# Patient Record
Sex: Male | Born: 1965
Health system: Southern US, Community
[De-identification: ages and names within clinical notes are randomized; demographics above are authoritative.]

## PROBLEM LIST (undated history)

## (undated) DIAGNOSIS — I10 Essential (primary) hypertension: Secondary | ICD-10-CM

## (undated) HISTORY — PX: HERNIA REPAIR: SHX51

---

## 1999-03-10 ENCOUNTER — Emergency Department (HOSPITAL_COMMUNITY): Admission: EM | Admit: 1999-03-10 | Discharge: 1999-03-10 | Payer: Self-pay

## 2015-09-18 ENCOUNTER — Emergency Department (HOSPITAL_COMMUNITY)
Admission: EM | Admit: 2015-09-18 | Discharge: 2015-09-18 | Disposition: A | Discharge status: Home / Self Care | Payer: BLUE CROSS/BLUE SHIELD | Source: Home / Self Care | Attending: Emergency Medicine | Admitting: Emergency Medicine

## 2015-09-18 ENCOUNTER — Encounter (HOSPITAL_COMMUNITY): Payer: Self-pay | Admitting: Emergency Medicine

## 2015-09-18 ENCOUNTER — Emergency Department (INDEPENDENT_AMBULATORY_CARE_PROVIDER_SITE_OTHER): Payer: BLUE CROSS/BLUE SHIELD

## 2015-09-18 DIAGNOSIS — J309 Allergic rhinitis, unspecified: Secondary | ICD-10-CM | POA: Diagnosis not present

## 2015-09-18 DIAGNOSIS — J40 Bronchitis, not specified as acute or chronic: Secondary | ICD-10-CM | POA: Diagnosis not present

## 2015-09-18 HISTORY — DX: Essential (primary) hypertension: I10

## 2015-09-18 MED ORDER — PREDNISONE 50 MG PO TABS: ORAL_TABLET | ORAL | Status: DC

## 2015-09-18 MED ORDER — CETIRIZINE HCL 10 MG PO TABS: 10.0000 mg | ORAL_TABLET | Freq: Every day | ORAL | Status: AC

## 2015-09-18 MED ORDER — FLUTICASONE PROPIONATE 50 MCG/ACT NA SUSP: 2.0000 | Freq: Every day | NASAL | Status: DC

## 2015-09-18 NOTE — ED Provider Notes (Signed)
CSN: [id number]     Arrival date & time 09/18/15  1619 History   First MD Initiated Contact with Patient 09/18/15 1657     Chief Complaint  Patient presents with  . Cough   (Consider location/radiation/quality/duration/timing/severity/associated sxs/prior Treatment) HPI  He is a 49 year old man here for evaluation of cough. He states for the last 3 weeks he has had a lot of postnasal drainage and cough, particularly at night. About 5 days ago, he started feeling poorly. His symptoms worsened today. He reports a discomfort in his left chest. He states it feels like there is a slight weight on his chest. Today, he reports some heaviness in his left arm. Nothing makes this better or worse. He was on the third floor of a building and going up the stairs does not worsen his pain. The pain is resolved with laying flat.  He also reports an intermittent bandlike headache. This resolves with some Advil. He has a history of hypertension. He is a nonsmoker. No family history of cardiac disease.  Past Medical History  Diagnosis Date  . Hypertension    Past Surgical History  Procedure Laterality Date  . Hernia repair     No family history on file. Social History  Substance Use Topics  . Smoking status: Never Smoker   . Smokeless tobacco: None  . Alcohol Use: Yes    Review of Systems As in history of present illness Allergies  Acetaminophen  Home Medications   Prior to Admission medications   Medication Sig Start Date End Date Taking? Authorizing Provider  lisinopril-hydrochlorothiazide (PRINZIDE,ZESTORETIC) 20-12.5 MG tablet Take 1 tablet by mouth daily.   Yes Historical Provider, MD  cetirizine (ZYRTEC) 10 MG tablet Take 1 tablet (10 mg total) by mouth daily. 09/18/15   Charm RingsErin J Mohamad Bruso, MD  fluticasone (FLONASE) 50 MCG/ACT nasal spray Place 2 sprays into both nostrils daily. 09/18/15   Charm RingsErin J Mckinnley Smithey, MD  predniSONE (DELTASONE) 50 MG tablet Take 1 pill daily for 5 days. 09/18/15   Charm RingsErin J  Novalyn Lajara, MD   Meds Ordered and Administered this Visit  Medications - No data to display  BP 123/79 mmHg  Pulse 100  Temp(Src) 99.2 F (37.3 C) (Oral)  Resp 18  SpO2 100% No data found.   Physical Exam  Constitutional: He is oriented to person, place, and time. He appears well-developed and well-nourished. No distress.  HENT:  Mouth/Throat: No oropharyngeal exudate.  Nasal mucosa is quite erythematous and boggy. There is some postnasal drainage seen. TMs normal bilaterally.  Neck: Neck supple.  Cardiovascular: Normal rate, regular rhythm and normal heart sounds.   No murmur heard. Pulmonary/Chest: Effort normal and breath sounds normal. No respiratory distress. He has no wheezes. He has no rales.  Lymphadenopathy:    He has no cervical adenopathy.  Neurological: He is alert and oriented to person, place, and time.    ED Course  Procedures (including critical care time) ED ECG REPORT   Date: 09/18/2015  Rate: 64  Rhythm: normal sinus rhythm  QRS Axis: left  Intervals: normal  ST/T Wave abnormalities: normal  Conduction Disutrbances:none  Narrative Interpretation: normal ekg; reviewed with Dr. Mayford Knifeurner, cardiologist on call.  Old EKG Reviewed: none available  I have personally reviewed the EKG tracing and agree with the computerized printout as noted.  Labs Review Labs Reviewed - No data to display  Imaging Review Dg Chest 2 View  09/18/2015  CLINICAL DATA:  Patient complains of cough x 3 to 4 weeks  w/ CP EXAM: CHEST  2 VIEW COMPARISON:  None. FINDINGS: Midline trachea. Normal heart size and mediastinal contours. No pleural effusion or pneumothorax. Clear lungs. IMPRESSION: No acute cardiopulmonary disease. Electronically Signed   By: Jeronimo Greaves M.D.   On: 09/18/2015 17:33      MDM   1. Bronchitis   2. Allergic rhinitis, unspecified allergic rhinitis type    We'll treat with prednisone, Zyrtec, and Flonase. Return precautions reviewed.    Charm Rings,  MD 09/18/15 [id number]

## 2015-09-18 NOTE — Discharge Instructions (Signed)
You are having some allergy symptoms, but have also developed a bronchitis. Take prednisone as prescribed. Please take Zyrtec and use Flonase daily. You should see improvement over the next 2-3 days. If your symptoms change or worsen, please go to the emergency room.

## 2015-09-18 NOTE — ED Notes (Signed)
Patient reports a 2 week history of cough, phlegm and particularly congested at night.  Feels sensation in left chest, fluttery feeling intermittently in left chest, c/o headache. At times, patient feels as though he has tight cap on his head.  Patient reports today he has felt left hand heaviness, no sob. Patient exercises 3-4 times per week

## 2015-10-15 ENCOUNTER — Other Ambulatory Visit: Payer: Self-pay | Admitting: Internal Medicine

## 2015-10-15 DIAGNOSIS — M5412 Radiculopathy, cervical region: Secondary | ICD-10-CM

## 2015-11-03 ENCOUNTER — Ambulatory Visit
Admission: RE | Admit: 2015-11-03 | Discharge: 2015-11-03 | Disposition: A | Discharge status: Home / Self Care | Payer: BLUE CROSS/BLUE SHIELD | Source: Ambulatory Visit | Attending: Internal Medicine | Admitting: Internal Medicine

## 2015-11-03 DIAGNOSIS — M5412 Radiculopathy, cervical region: Secondary | ICD-10-CM

## 2015-11-19 ENCOUNTER — Emergency Department (HOSPITAL_COMMUNITY)
Admission: EM | Admit: 2015-11-19 | Discharge: 2015-11-19 | Disposition: A | Discharge status: Home / Self Care | Payer: BLUE CROSS/BLUE SHIELD | Source: Home / Self Care | Attending: Family Medicine | Admitting: Family Medicine

## 2015-11-19 ENCOUNTER — Encounter (HOSPITAL_COMMUNITY): Payer: Self-pay | Admitting: Emergency Medicine

## 2015-11-19 DIAGNOSIS — H6591 Unspecified nonsuppurative otitis media, right ear: Secondary | ICD-10-CM | POA: Diagnosis not present

## 2015-11-19 MED ORDER — CETIRIZINE HCL 10 MG PO CAPS: 10.0000 mg | ORAL_CAPSULE | Freq: Once | ORAL | Status: DC

## 2015-11-19 MED ORDER — AMOXICILLIN 500 MG PO CAPS: 500.0000 mg | ORAL_CAPSULE | Freq: Three times a day (TID) | ORAL | Status: DC

## 2015-11-19 NOTE — Discharge Instructions (Signed)
Serous Otitis Media Serous otitis media is fluid in the middle ear space. This space contains the bones for hearing and air. Air in the middle ear space helps to transmit sound.  The air gets there through the eustachian tube. This tube goes from the back of the nose (nasopharynx) to the middle ear space. It keeps the pressure in the middle ear the same as the outside world. It also helps to drain fluid from the middle ear space. CAUSES  Serous otitis media occurs when the eustachian tube gets blocked. Blockage can come from:  Ear infections.  Colds and other upper respiratory infections.  Allergies.  Irritants such as cigarette smoke.  Sudden changes in air pressure (such as descending in an airplane).  Enlarged adenoids.  A mass in the nasopharynx. During colds and upper respiratory infections, the middle ear space can become temporarily filled with fluid. This can happen after an ear infection also. Once the infection clears, the fluid will generally drain out of the ear through the eustachian tube. If it does not, then serous otitis media occurs. SIGNS AND SYMPTOMS   Hearing loss.  A feeling of fullness in the ear, without pain.  Young children may not show any symptoms but may show slight behavioral changes, such as agitation, ear pulling, or crying. DIAGNOSIS  Serous otitis media is diagnosed by an ear exam. Tests may be done to check on the movement of the eardrum. Hearing exams may also be done. TREATMENT  The fluid most often goes away without treatment. If allergy is the cause, allergy treatment may be helpful. Fluid that persists for several months may require minor surgery. A small tube is placed in the eardrum to:  Drain the fluid.  Restore the air in the middle ear space. In certain situations, antibiotic medicines are used to avoid surgery. Surgery may be done to remove enlarged adenoids (if this is the cause). HOME CARE INSTRUCTIONS   Keep children away from  tobacco smoke.  Keep all follow-up visits as directed by your health care provider. SEEK MEDICAL CARE IF:   Your hearing is not better in 3 months.  Your hearing is worse.  You have ear pain.  You have drainage from the ear.  You have dizziness.  You have serous otitis media only in one ear or have any bleeding from your nose (epistaxis).  You notice a lump on your neck. MAKE SURE YOU:  Understand these instructions.   Will watch your condition.   Will get help right away if you are not doing well or get worse.    This information is not intended to replace advice given to you by your health care provider. Make sure you discuss any questions you have with your health care provider.   Document Released: 01/06/2004 Document Revised: 11/06/2014 Document Reviewed: 05/13/2013 Elsevier Interactive Patient Education 2016 [employer].  

## 2015-11-19 NOTE — ED Provider Notes (Signed)
CSN: [id number]     Arrival date & time 11/19/15  1644 History   First MD Initiated Contact with Patient 11/19/15 1709     Chief Complaint  Patient presents with  . Ear Pain   (Consider location/radiation/quality/duration/timing/severity/associated sxs/prior Treatment) HPI Patient states for the last 2-3 days he's been feeling off balance. He was seen by his primary care provider on Wednesday and was told that he had a little early change in his right ear but no definitive diagnosis was made. A cyst symptoms feel worse at this time feels a little drunk. No associated nausea. He has had a recent URI. Past Medical History  Diagnosis Date  . Hypertension    Past Surgical History  Procedure Laterality Date  . Hernia repair     No family history on file. Social History  Substance Use Topics  . Smoking status: Never Smoker   . Smokeless tobacco: None  . Alcohol Use: Yes    Review of Systems ROS +'ve dizziness right ear fullness.  Denies: HEADACHE, NAUSEA, ABDOMINAL PAIN, CHEST PAIN, CONGESTION, DYSURIA, SHORTNESS OF BREATH  Allergies  Acetaminophen  Home Medications   Prior to Admission medications   Medication Sig Start Date End Date Taking? Authorizing Provider  amoxicillin (AMOXIL) 500 MG capsule Take 1 capsule (500 mg total) by mouth 3 (three) times daily. 11/19/15   Tharon Aquas, PA  cetirizine (ZYRTEC) 10 MG tablet Take 1 tablet (10 mg total) by mouth daily. 09/18/15   Charm Rings, MD  Cetirizine HCl 10 MG CAPS Take 1 capsule (10 mg total) by mouth once. 11/19/15   Tharon Aquas, PA  fluticasone (FLONASE) 50 MCG/ACT nasal spray Place 2 sprays into both nostrils daily. 09/18/15   Charm Rings, MD  lisinopril-hydrochlorothiazide (PRINZIDE,ZESTORETIC) 20-12.5 MG tablet Take 1 tablet by mouth daily.    Historical Provider, MD  predniSONE (DELTASONE) 50 MG tablet Take 1 pill daily for 5 days. 09/18/15   Charm Rings, MD   Meds Ordered and Administered this Visit   Medications - No data to display  BP 141/90 mmHg  Pulse 82  Temp(Src) 98.2 F (36.8 C) (Oral)  Resp 18  SpO2 100% No data found.   Physical Exam  Constitutional: He is oriented to person, place, and time. He appears well-developed and well-nourished.  HENT:  Head: Normocephalic and atraumatic.  Right Ear: Hearing, tympanic membrane, external ear and ear canal normal.  Left Ear: External ear normal.  Pulmonary/Chest: Effort normal and breath sounds normal.  Musculoskeletal: Normal range of motion.  Neurological: He is alert and oriented to person, place, and time.  Skin: Skin is warm and dry.  Psychiatric: He has a normal mood and affect. His behavior is normal. Judgment and thought content normal.  Nursing note and vitals reviewed.   ED Course  Procedures (including critical care time)  Labs Review Labs Reviewed - No data to display  Imaging Review No results found.   Visual Acuity Review  Right Eye Distance:   Left Eye Distance:   Bilateral Distance:    Right Eye Near:   Left Eye Near:    Bilateral Near:         MDM   1. Middle ear effusion, right    Patient is advised to continue home symptomatic treatment. Prescription for amoxil sent pharmacy patient has indicated. Patient is advised that if there are new or worsening symptoms or attend the emergency department, or contact primary care provider. Instructions of care provided  discharged home in stable condition.  THIS NOTE WAS GENERATED USING A VOICE RECOGNITION SOFTWARE PROGRAM. ALL REASONABLE EFFORTS  WERE MADE TO PROOFREAD THIS DOCUMENT FOR ACCURACY.     Tharon Aquas, PA 11/19/15 1910

## 2015-11-19 NOTE — ED Notes (Signed)
C/o right ear pain onset x7 days associated w/"feeling unbalanced" A&O x4... No acute distress.

## 2015-11-29 ENCOUNTER — Ambulatory Visit (INDEPENDENT_AMBULATORY_CARE_PROVIDER_SITE_OTHER): Payer: BLUE CROSS/BLUE SHIELD | Admitting: Neurology

## 2015-11-29 ENCOUNTER — Other Ambulatory Visit (INDEPENDENT_AMBULATORY_CARE_PROVIDER_SITE_OTHER): Payer: BLUE CROSS/BLUE SHIELD

## 2015-11-29 ENCOUNTER — Encounter: Payer: Self-pay | Admitting: Neurology

## 2015-11-29 VITALS — BP 120/86 | HR 87 | Ht 72.0 in | Wt 200.1 lb

## 2015-11-29 DIAGNOSIS — R292 Abnormal reflex: Secondary | ICD-10-CM

## 2015-11-29 DIAGNOSIS — R200 Anesthesia of skin: Secondary | ICD-10-CM | POA: Diagnosis not present

## 2015-11-29 DIAGNOSIS — R202 Paresthesia of skin: Secondary | ICD-10-CM

## 2015-11-29 LAB — VITAMIN B12: Vitamin B-12: 603 pg/mL (ref 211–911)

## 2015-11-29 NOTE — Patient Instructions (Addendum)
1.  MRI brain wwo contrast. We have scheduled you at [facility] for your OPEN MRI on 12/06/2015 at 12:30 pm. Please arrive 30 minutes prior and go to [address]. If you need to change this appt please call [phone]. 2.  Check blood work . Your provider has requested that you have labwork completed today. Please go to [facility] ([id number]) on the second floor of this building before leaving the office today. You do not need to check in. If you are not called within 15 minutes please check with the front desk.  3.  We will call you with the results 4.  Follow-up with your primary care provider for your ear discomfort  Return to clinic 49-months

## 2015-11-29 NOTE — Progress Notes (Signed)
Note routed

## 2015-11-29 NOTE — Progress Notes (Signed)
[facility] Clinic Note - Initial Visit   Date: 11/29/2015  Brad Henry MRN: [id number] DOB: 09/29/1966   Dear Dr. Nicholos Johns:  Thank you for your kind referral of Brad Henry for consultation of generalized dysesthesias. Although his history is well known to you, please allow [place] to reiterate it for the purpose of our medical record. The patient was accompanied to the clinic by self.   History of Present Illness: Brad Henry is a 50 y.o. right-handed African American male with hypertension presenting for evaluation of generalized dysesthesias.    Starting around October 2016, he began experiencing a fluttering sensation of the left anterior thigh which was most noticeable in the morning and last 15-60 min.  It would often resolve with laying down for 15 minutes.  Symptoms were occuring daily initially and slowly dissipated, he last experienced this in early December.  Around thanksgiving, he started having left foot tingling/numbness over the sole of the foot which lasted a week.  This can occur when he wakes up in the morning nearly every day.  Thereafter, he had a buzzing sensation over the left arm radiating into the chest, lasting 5-10 min. It is worse when he is stressed. If he move his hands, it has sometimes help resolve the symptoms.  Symptoms are predominately on the left side, but also involve his right arm and leg. There is no weakness.  PCP's clinic notes, imaging and labs were reviewed. MRI cervical spine showed moderate right foraminal stenosis with possible C6 nerve root encroachment.    He had been working out 2-3 times per week until October but denies any injury.   He has been separated from his wife in 2013/04/08.  His father passed away in 06-Jan-2014.  He endorses significant stress related to personal and financial issues.  He recalls not having any symptoms a day when he was off, but still experiences it on the weekends.  Out-side  paper records, electronic medical record, and images have been reviewed where available and summarized as:  MRI cervical spine wo contrast 11/03/2015: 1. Essentially single level spondylosis at C5-6 with asymmetric right foraminal disc osteophyte complex causing moderate right foraminal narrowing and possible right C6 nerve root encroachment.  No cord deformity. 2. No other significant disc space findings  Labs 11/25/2015:  TSH 1.260, CBC and CMP within normal limits  Past Medical History  Diagnosis Date  . Hypertension     Past Surgical History  Procedure Laterality Date  . Hernia repair       Medications:  Outpatient Encounter Prescriptions as of 11/29/2015  Medication Sig Note  . amoxicillin (AMOXIL) 500 MG capsule Take 1 capsule (500 mg total) by mouth 3 (three) times daily.   . cetirizine (ZYRTEC) 10 MG tablet Take 1 tablet (10 mg total) by mouth daily.   . Clindamycin-Benzoyl Per, Refr, gel  11/29/2015: Received from: External Pharmacy  . fluticasone (FLONASE) 50 MCG/ACT nasal spray Place 2 sprays into both nostrils daily.   Marland Kitchen lisinopril-hydrochlorothiazide (PRINZIDE,ZESTORETIC) 20-12.5 MG tablet Take 1 tablet by mouth daily.   . [DISCONTINUED] Cetirizine HCl 10 MG CAPS Take 1 capsule (10 mg total) by mouth once.   . [DISCONTINUED] predniSONE (DELTASONE) 50 MG tablet Take 1 pill daily for 5 days.    No facility-administered encounter medications on file as of 11/29/2015.     Allergies:  Allergies  Allergen Reactions  . Acetaminophen Hives    Family History: Family History  Problem Relation  Age of Onset  . Hypertension Mother   . Kidney cancer Father     Deceased, 85  . High Cholesterol Father   . Diabetes Father   . Healthy Brother   . Healthy Son   . Asperger's syndrome Son     Social History: Social History  Substance Use Topics  . Smoking status: Never Smoker   . Smokeless tobacco: Never Used  . Alcohol Use: 0.0 oz/week    0 Standard drinks or equivalent  per week     Comment: 4 beers per month, socially   Social History   Social History Narrative   Lives alone in an apartment on the 3rd floor. Has 2 sons.     Works as a Firefighter at [employer].     Education: college.   Currently separated from wife.    Review of Systems:  CONSTITUTIONAL: No fevers, chills, night sweats, or weight loss.   EYES: No visual changes or eye pain ENT: No hearing changes.  No history of nose bleeds.   RESPIRATORY: No cough, wheezing and shortness of breath.   CARDIOVASCULAR: Negative for chest pain, and palpitations.   GI: Negative for abdominal discomfort, blood in stools or black stools.  No recent change in bowel habits.   GU:  No history of incontinence.   MUSCLOSKELETAL: No history of joint pain or swelling.  No myalgias.   SKIN: Negative for lesions, rash, and itching.   HEMATOLOGY/ONCOLOGY: Negative for prolonged bleeding, bruising easily, and swollen nodes.  No history of cancer.   ENDOCRINE: Negative for cold or heat intolerance, polydipsia or goiter.   PSYCH:  No depression +anxiety symptoms.   NEURO: As Above.   Vital Signs:  BP 120/86 mmHg  Pulse 87  Ht 6' (1.829 m)  Wt 200 lb 1 oz (90.748 kg)  BMI 27.13 kg/m2  SpO2 98% Pain Scale: 0 on a scale of 0-10   General Medical Exam:   General:  Well appearing, comfortable.   Eyes/ENT: see cranial nerve examination.   Neck: No masses appreciated.  Full range of motion without tenderness.  No carotid bruits. Respiratory:  Clear to auscultation, good air entry bilaterally.   Cardiac:  Regular rate and rhythm, no murmur.   Extremities:  No deformities, edema, or skin discoloration.  Skin:  No rashes or lesions.  Neurological Exam: MENTAL STATUS including orientation to time, place, person, recent and remote memory, attention span and concentration, language, and fund of knowledge is normal.  Speech is not dysarthric.  CRANIAL NERVES: II:  No visual field defects.  Unremarkable fundi.    III-IV-VI: Pupils equal round and reactive to light.  Normal conjugate, extra-ocular eye movements in all directions of gaze.  No nystagmus.  No ptosis.   V:  Normal facial sensation.    VII:  Normal facial symmetry and movements.  No pathologic facial reflexes.  VIII:  Normal hearing and vestibular function.   IX-X:  Normal palatal movement.   XI:  Normal shoulder shrug and head rotation.   XII:  Normal tongue strength and range of motion, no deviation or fasciculation.  MOTOR:  No atrophy, fasciculations or abnormal movements.  No pronator drift.  Tone is normal.    Right Upper Extremity:    Left Upper Extremity:    Deltoid  5/5   Deltoid  5/5   Biceps  5/5   Biceps  5/5   Triceps  5/5   Triceps  5/5   Wrist extensors  5/5  Wrist extensors  5/5   Wrist flexors  5/5   Wrist flexors  5/5   Finger extensors  5/5   Finger extensors  5/5   Finger flexors  5/5   Finger flexors  5/5   Dorsal interossei  5/5   Dorsal interossei  5/5   Abductor pollicis  5/5   Abductor pollicis  5/5   Tone (Ashworth scale)  0  Tone (Ashworth scale)  0   Right Lower Extremity:    Left Lower Extremity:    Hip flexors  5/5   Hip flexors  5/5   Hip extensors  5/5   Hip extensors  5/5   Knee flexors  5/5   Knee flexors  5/5   Knee extensors  5/5   Knee extensors  5/5   Dorsiflexors  5/5   Dorsiflexors  5/5   Plantarflexors  5/5   Plantarflexors  5/5   Toe extensors  5/5   Toe extensors  5/5   Toe flexors  5/5   Toe flexors  5/5   Tone (Ashworth scale)  0  Tone (Ashworth scale)  0   MSRs:  Right                                                                 Left brachioradialis 2+  brachioradialis 2+  biceps 2+  biceps 2+  triceps 2+  triceps 2+  patellar 3+  patellar 3+  ankle jerk 2+  ankle jerk 2+  Hoffman no  Hoffman no  plantar response down  plantar response down   SENSORY:  Normal and symmetric perception of light touch, pinprick, vibration, and proprioception.  Romberg's sign absent.    COORDINATION/GAIT: Normal finger-to- nose-finger and heel-to-shin.  Intact rapid alternating movements bilaterally.  Able to rise from a chair without using arms.  Gait narrow based and stable. Tandem and stressed gait intact.    IMPRESSION: Brad Henry is a 50 year-old gentleman referred for evaluation of predominately left-sided dysesthesias involving the arm and foot.  His exam shows symmetric and brisk patella jerks without association upper motor neurons findings.  Motor and sensory exam is normal.  Because he has various constellation of dysesthesias involving left >> right side, it would be prudent to exclude the demyelinating disease, so MRI brain will be ordered.  I will also screen him for nutritional deficiencies which can cause paresthesias.    PLAN/RECOMMENDATIONS:  1.  Check MRI brain wwo contrast 2.  Check vitamin B12 and copper 3.  Follow-up with PCP regarding ear infection 4.  If MRI brain is normal and symptoms persist, the next step will be NCS/EMG  Return to clinic in 3 months.   The duration of this appointment visit was 35 minutes of face-to-face time with the patient.  Greater than 50% of this time was spent in counseling, explanation of diagnosis, planning of further management, and coordination of care.   Thank you for allowing me to participate in patient's care.  If I can answer any additional questions, I would be pleased to do so.    Sincerely,    Donika K. Allena Katz, DO

## 2015-12-03 LAB — COPPER, SERUM: COPPER: 90 ug/dL (ref 70–175)

## 2015-12-06 ENCOUNTER — Ambulatory Visit
Admission: RE | Admit: 2015-12-06 | Discharge: 2015-12-06 | Disposition: A | Discharge status: Home / Self Care | Payer: BLUE CROSS/BLUE SHIELD | Source: Ambulatory Visit | Attending: Neurology | Admitting: Neurology

## 2015-12-06 DIAGNOSIS — R202 Paresthesia of skin: Secondary | ICD-10-CM

## 2015-12-06 DIAGNOSIS — R292 Abnormal reflex: Secondary | ICD-10-CM

## 2015-12-06 DIAGNOSIS — R200 Anesthesia of skin: Secondary | ICD-10-CM

## 2015-12-06 MED ORDER — GADOBENATE DIMEGLUMINE 529 MG/ML IV SOLN
19.0000 mL | Freq: Once | INTRAVENOUS | Status: AC | PRN
  Administered 2015-12-06: 19 mL via INTRAVENOUS

## 2015-12-24 ENCOUNTER — Ambulatory Visit: Payer: BLUE CROSS/BLUE SHIELD | Attending: Internal Medicine | Admitting: Physical Therapy

## 2015-12-24 DIAGNOSIS — R2681 Unsteadiness on feet: Secondary | ICD-10-CM | POA: Insufficient documentation

## 2015-12-24 DIAGNOSIS — R42 Dizziness and giddiness: Secondary | ICD-10-CM | POA: Insufficient documentation

## 2015-12-24 NOTE — Therapy (Signed)
[facility] [address] [place], [place], [address] Phone: [phone]   Fax:  [phone]  Physical Therapy Evaluation  Patient Details  Name: Brad Henry MRN: [id number] Date of Birth: [birth year] Referring Provider: Georgianne Fick  Encounter Date: 12/24/2015      PT End of Session - 12/24/15 1304    Visit Number 1   Number of Visits 9   Date for PT Re-Evaluation 01/23/16   Authorization Type BCBS   PT Start Time 1102   PT Stop Time 1150   PT Time Calculation (min) 48 min   Activity Tolerance Patient tolerated treatment well   Behavior During Therapy [facility] for tasks assessed/performed      Past Medical History  Diagnosis Date  . Hypertension     Past Surgical History  Procedure Laterality Date  . Hernia repair      There were no vitals filed for this visit.  Visit Diagnosis:  Dizziness and giddiness  Unsteadiness      Subjective Assessment - 12/24/15 1242    Subjective Pt arrives today stating that he has been feeling "off balance" for about one month. Notices that around 11/09/15 he started feeling off balance. Pt says he was diagnosed with "having water on the ear" on 11/19/15. Says first week of February his feelings off balance peaked. Says he feels "wobbly" and like he falls to the right. Says when he is laying in bed and makes turns to the right and laying with eyes closed he feels as if the room is "wavy." Had stress test and MRI done which were both clear. Says he feels different sensations in R ear. Describes periods of nausea. Has to walk with cane at times, not within the last week.   Patient is accompained by: Family member   Patient Stated Goals "I would like to be more balanced and get back to exercising."   Currently in Pain? No/denies           [facility] PT Assessment - 12/24/15 0001    Assessment   Medical Diagnosis Vertigo   Referring Provider Georgianne Fick   Onset  Date/Surgical Date 11/19/15   Hand Dominance Right   Precautions   Precautions None   Balance Screen   Has the patient fallen in the past 6 months No   Has the patient had a decrease in activity level because of a fear of falling?  Yes   Is the patient reluctant to leave their home because of a fear of falling?  Yes   Home Environment   Living Environment Private residence   Living Arrangements Alone   Available Help at Discharge Family   Type of Home Apartment   Home Access Stairs to enter   Entrance Stairs-Number of Steps --  3rd floor   Home Layout One level   Home Equipment [place] - single point   Prior Function   Level of Independence Independent   Vocation Full time employment   Education administrator at [employer]   Leisure gym 3x/week   Cognition   Overall Cognitive Status Within Functional Limits for tasks assessed   Observation/Other Assessments   Observations Pt would frequently pause intermittently for <10 seconds and subjectively report dizziness/light-headedness   ROM / Strength   AROM / PROM / Strength Strength   Strength   Overall Strength Within functional limits for tasks performed   Overall Strength Comments bilateral LE strength grossly 5/5  Vestibular Assessment - 12/24/15 1247    Symptom Behavior   Type of Dizziness Lightheadedness  "Bobbing"   Frequency of Dizziness intermittent   Duration of Dizziness short   Aggravating Factors Turning head sideways  rolling in bed, closing eyes   Relieving Factors Rest   Occulomotor Exam   Occulomotor Alignment Normal   Spontaneous Absent   Gaze-induced Absent   Smooth Pursuits Intact   Saccades Intact  slight saccade with R finger to nose   Vestibulo-Occular Reflex   VOR to Slow Head Movement Normal   VOR Cancellation Normal   Comment Head thrust test positive to L   Positional Testing   Dix-Hallpike Dix-Hallpike Right;Dix-Hallpike Left   Sidelying Test Sidelying  Right;Sidelying Left   Horizontal Canal Testing Horizontal Canal Right;Horizontal Canal Left   Dix-Hallpike Right   Dix-Hallpike Right Symptoms No nystagmus   Dix-Hallpike Left   Dix-Hallpike Left Symptoms No nystagmus   Sidelying Right   Sidelying Right Symptoms No nystagmus   Sidelying Left   Sidelying Left Symptoms No nystagmus   Horizontal Canal Right   Horizontal Canal Right Duration approximately 2-3 beats    Horizontal Canal Right Symptoms Nystagmus;Other (comment)  too short to fully assess, pt symptomatic   Horizontal Canal Left   Horizontal Canal Left Symptoms Normal   Positional Sensitivities   Rolling Right Moderate dizziness  6-7/10 per pt report   Positional Sensitivities Comments Pt verbalized subjective reports of dizziness with rolling R, no nystagmus present          Vestibular Treatment/Exercise - 12/24/15 1254    Vestibular Treatment/Exercise   Vestibular Treatment Provided Habituation   Habituation Exercises Horizontal Roll   Horizontal Roll   Number of Reps  5   Symptom Description  Pt described subjective reports of dizziness with R horizontal rolling, 6-7/10. Pt instructed to maintain position until symptoms dismissed before moving to next position.            PT Education - 12/24/15 1255    Education provided Yes   Education Details Pt given habituation exercises for HEP - please see pt instructions. Pt instructed to monitor triggers for symptoms, instructed to take BP when symptomatic.    Person(s) Educated Patient   Methods Explanation;Handout;Verbal cues   Comprehension Verbalized understanding;Returned demonstration          PT Short Term Goals - 12/24/15 1305    PT SHORT TERM GOAL #1   Title STGs=LTGs           PT Long Term Goals - 12/24/15 1305    PT LONG TERM GOAL #1   Title Pt will verbalize understanding of BPPV and the role it plays in vertigo/dizziness. TARGET DATE = 01/23/16   Time 4   Period Weeks   Status New   PT  LONG TERM GOAL #2   Title PT will administer SOT/mCTSIB and write goal accordingly.   Time 4   Period Weeks   Status New   PT LONG TERM GOAL #3   Title PT will administer FGA/DGI and write goal accordingly.   Time 4   Period Weeks   Status New   PT LONG TERM GOAL #4   Title The patient will report symptoms of dizziness/light headedness 0/10 with horizontal rolling R x5 reps to demonstrate increased tolerance to positional changes.   Time 4   Period Weeks   Status New   PT LONG TERM GOAL #5   Title The patient will improve DHI from 48  to 32 to demonstrate improvements in dizziness and function.   Baseline DHI = 48   Time 4   Period Weeks   Status New            Plan - 12/24/15 1256    Clinical Impression Statement The patient is a 50 y.o. M with PMH of HTN who reported to E.D. on 11/19/15 with reports of dizziness and feeling off-balance that have gotten better but since not dissipated. His feelings of imbalance are limiting his ability to leave the home, causing him to ambulate with a cane at times, and restricting his previous level of activity including going ot the gym 3x/week. Today's evaluation revealed positional sensitivity when pt performing horizontal roll to R and 2-3 beats of R beating nystagmus with horizontal roll test to the R, likely implicated some level of BPPV for R horizontal canal. The patient was given an HEP for habituation exercises to address positional sensitivities. The pt would benefit from skilled PT 2x/week for 4 weeks in order to address the aforementioned impairments and return the patient to his prior level.   Pt will benefit from skilled therapeutic intervention in order to improve on the following deficits Decreased balance;Dizziness   Rehab Potential Good   Clinical Impairments Affecting Rehab Potential Young age. Potential barriers include increased anxiety   PT Frequency 2x / week   PT Duration 4 weeks   PT Treatment/Interventions Canalith  Repostioning;Gait training;Stair training;Therapeutic activities;Therapeutic exercise;Balance training;Neuromuscular re-education;Patient/family education;Manual techniques;Vestibular;DME Instruction   PT Next Visit Plan FGA/DGI and write goal, SOT/mCTSIB and write goal. Assess orthostatics if warranted. Review HEP. progress habituation as tolerated, VOR, balance activities prn   PT Home Exercise Plan habituation horizontal rolling   Consulted and Agree with Plan of Care Patient        Problem List There are no active problems to display for this patient.   Celene Squibb, SPT 12/24/2015, 4:58 PM  [facility] [address] [place], [place], [address] Phone: [phone]   Fax:  [phone]  Name: TYON CERASOLI MRN: [id number] Date of Birth: 1966-05-11

## 2015-12-24 NOTE — Patient Instructions (Signed)
Tip Card 1.The goal of habituation training is to assist in decreasing symptoms of vertigo, dizziness, or nausea provoked by specific head and body motions. 2.These exercises may initially increase symptoms; however, be persistent and work through symptoms. With repetition and time, the exercises will assist in reducing or eliminating symptoms. 3.Exercises should be stopped and discussed with the therapist if you experience any of the following: - Sudden change or fluctuation in hearing - New onset of ringing in the ears, or increase in current intensity - Any fluid discharge from the ear - Severe pain in neck or back - Extreme nausea  Copyright  VHI. All rights reserved.  Rolling   With pillow under head, start on back. Roll to your right side.  Hold until dizziness stops, plus 20 seconds and then roll to the left side.  Hold until dizziness stops, plus 20 seconds.  Repeat sequence 5 times per session. Do 2 sessions per day.  Copyright  VHI. All rights reserved.    

## 2015-12-28 ENCOUNTER — Ambulatory Visit: Payer: BLUE CROSS/BLUE SHIELD | Admitting: Rehabilitative and Restorative Service Providers"

## 2015-12-28 DIAGNOSIS — R2681 Unsteadiness on feet: Secondary | ICD-10-CM

## 2015-12-28 DIAGNOSIS — R42 Dizziness and giddiness: Secondary | ICD-10-CM | POA: Diagnosis not present

## 2015-12-28 NOTE — Addendum Note (Signed)
Addended by: [name] A on: 12/28/2015 04:49 PM   Modules accepted: Orders

## 2015-12-28 NOTE — Patient Instructions (Signed)
Feet Apart (Compliant Surface) Varied Arm Positions - Eyes Closed    Stand on compliant surface: ___pillow_____ with feet shoulder width apart and arms out. Close eyes and visualize upright position. Hold__30__ seconds. Repeat _3___ times per session. Do _2___ sessions per day.  Copyright  VHI. All rights reserved.   Feet Apart (Compliant Surface) Head Motion - Eyes Open    With eyes open, standing on compliant surface: __pillow______, feet shoulder width apart, move head slowly: up and down 5 times.  Let symptoms settle.  Then turn head side to side 5 times.   Repeat _ 2 sets per session. Do _2___ sessions per day.  Copyright  VHI. All rights reserved.   Continue prior HEP.

## 2015-12-28 NOTE — Therapy (Signed)
[facility] [address] [place], [place], [address] Phone: [phone]   Fax:  [phone]  Physical Therapy Treatment  Patient Details  Name: Brad Henry MRN: [id number] Date of Birth: [birth year] Referring Provider: Georgianne Fick  Encounter Date: 12/28/2015      PT End of Session - 12/28/15 0948    Visit Number 2   Number of Visits 9   Date for PT Re-Evaluation 01/23/16   Authorization Type BCBS   PT Start Time 0800   PT Stop Time 0847   PT Time Calculation (min) 47 min   Equipment Utilized During Treatment Gait belt   Activity Tolerance Patient tolerated treatment well   Behavior During Therapy [facility] for tasks assessed/performed      Past Medical History  Diagnosis Date  . Hypertension     Past Surgical History  Procedure Laterality Date  . Hernia repair      There were no vitals filed for this visit.  Visit Diagnosis:  Dizziness and giddiness  Unsteadiness      Subjective Assessment - 12/28/15 0802    Subjective Pt arrives today stating that he is "feeling pretty good." Says Sunday and Monday were "off balance" for him. Says he was at church and at one point he needed his cane. Also describes difficulty when walking in supermarket. Has been consistent with HEP.   Patient Stated Goals "I would like to be more balanced and get back to exercising."   Currently in Pain? No/denies            [facility] PT Assessment - 12/28/15 0808    Ambulation/Gait   Gait velocity 3.30 ft/sec  9.93 sec   Functional Gait  Assessment   Gait assessed  Yes   Gait Level Surface Walks 20 ft in less than 5.5 sec, no assistive devices, good speed, no evidence for imbalance, normal gait pattern, deviates no more than 6 in outside of the 12 in walkway width.   Change in Gait Speed Able to smoothly change walking speed without loss of balance or gait deviation. Deviate no more than 6 in outside of the 12 in walkway  width.   Gait with Horizontal Head Turns Performs head turns smoothly with slight change in gait velocity (eg, minor disruption to smooth gait path), deviates 6-10 in outside 12 in walkway width, or uses an assistive device.   Gait with Vertical Head Turns Performs task with slight change in gait velocity (eg, minor disruption to smooth gait path), deviates 6 - 10 in outside 12 in walkway width or uses assistive device   Gait and Pivot Turn Pivot turns safely within 3 sec and stops quickly with no loss of balance.   Step Over Obstacle Is able to step over 2 stacked shoe boxes taped together (9 in total height) without changing gait speed. No evidence of imbalance.   Gait with Narrow Base of Support Is able to ambulate for 10 steps heel to toe with no staggering.   Gait with Eyes Closed Walks 20 ft, uses assistive device, slower speed, mild gait deviations, deviates 6-10 in outside 12 in walkway width. Ambulates 20 ft in less than 9 sec but greater than 7 sec.   Ambulating Backwards Walks 20 ft, uses assistive device, slower speed, mild gait deviations, deviates 6-10 in outside 12 in walkway width.   Steps Alternating feet, must use rail.   Total Score 25   FGA comment: Interpretation: 25-28 = low risk fall  Vestibular Assessment - 12/28/15 0806    Other Tests   Comments SOT test administered   Horizontal Canal Right   Horizontal Canal Right Symptoms Normal   Horizontal Canal Left   Horizontal Canal Left Symptoms Normal   Orthostatics   BP sitting 126/81 mmHg   HR sitting 76           [facility] Adult PT Treatment/Exercise - 12/28/15 1610    Neuro Re-ed    Neuro Re-ed Details  The following performed in corner set up with chair in front for pt safety: standing on pillow wide base eyes closed 3x30 sec. Standing on pillow wide base with vertical/horizontal head turns x8 each. Verbal cues to inc speed of head turn.            PT Education - 12/28/15 0945    Education  provided Yes   Education Details Pt educated on SOT score, 3 systems of balance, and the role of each system regarding balance. HEP for corner balance exercises implemented - please see pt instructions. Pt instructed to resume normal activity level including going to the gym as long as he is safe and on stable surface. Advised to gradually increase activity level and to not any concerns he has during his time at the gym. Discussed active breaks from computer/screen time at work, including getting up to stretch/walk for 5-10 min every hour   Person(s) Educated Patient   Methods Explanation;Demonstration;Handout;Verbal cues   Comprehension Verbalized understanding;Returned demonstration          PT Short Term Goals - 12/24/15 1305    PT SHORT TERM GOAL #1   Title STGs=LTGs           PT Long Term Goals - 12/28/15 1322    PT LONG TERM GOAL #1   Title Pt will verbalize understanding of BPPV and the role it plays in vertigo/dizziness. TARGET DATE = 01/23/16   Time 4   Period Weeks   Status On-going   PT LONG TERM GOAL #2   Title The patient will improve SOT vestibular score to WNL of age related norms to demonstrate improved use of vestibular input for balance.   Baseline SOT vestibular = 30% on 12/28/15, SOT composite = 65   Time 4   Period Weeks   Status New   PT LONG TERM GOAL #3   Title The patient will improve FGA to 30/30 to demonstrate improvements in functional gait with dynamic activities.   Baseline FGA = 25/30 on 12/28/15   Time 4   Period Weeks   Status New   PT LONG TERM GOAL #4   Title The patient will report symptoms of dizziness/light headedness 0/10 with horizontal rolling R x5 reps to demonstrate increased tolerance to positional changes.   Time 4   Period Weeks   Status On-going   PT LONG TERM GOAL #5   Title The patient will improve DHI from 48 to 32 to demonstrate improvements in dizziness and function.   Baseline DHI = 48   Time 4   Period Weeks   Status  On-going          Plan - 12/28/15 9604    Clinical Impression Statement SOT testing reveals pt relies heavily on somatosensory information for balance, lower than average norms with regards to visual and particularly vestibular input for balance. HEP given to isolate and improve these systems. No nystagmus/symptoms with positional testing today. Continue per POC.   Pt will benefit from skilled  therapeutic intervention in order to improve on the following deficits Decreased balance;Dizziness   Rehab Potential Good   Clinical Impairments Affecting Rehab Potential Young age. Potential barriers include increased anxiety   PT Frequency 2x / week   PT Duration 4 weeks   PT Treatment/Interventions Canalith Repostioning;Gait training;Stair training;Therapeutic activities;Therapeutic exercise;Balance training;Neuromuscular re-education;Patient/family education;Manual techniques;Vestibular;DME Instruction   PT Next Visit Plan Review HEP. Challenge vestibular system - compliant surfaces with eyes closed activities. VOR x1. Balance activities. Discuss gym program/activity levels with patient.   PT Home Exercise Plan Standing wide base eyes closed on compliant surface, standing wide base eyes open vertical/horizontal head turns compliant surface   Consulted and Agree with Plan of Care Patient        Problem List There are no active problems to display for this patient.   Celene Squibb, SPT 12/28/2015, 1:46 PM  [facility] [address] [place], [place], [address] Phone: [phone]   Fax:  [phone]  Name: Brad Henry MRN: [id number] Date of Birth: 1966/02/23

## 2015-12-31 ENCOUNTER — Ambulatory Visit: Payer: BLUE CROSS/BLUE SHIELD | Attending: Internal Medicine | Admitting: Physical Therapy

## 2015-12-31 DIAGNOSIS — R2681 Unsteadiness on feet: Secondary | ICD-10-CM | POA: Diagnosis present

## 2015-12-31 DIAGNOSIS — R42 Dizziness and giddiness: Secondary | ICD-10-CM | POA: Insufficient documentation

## 2015-12-31 DIAGNOSIS — R2689 Other abnormalities of gait and mobility: Secondary | ICD-10-CM | POA: Insufficient documentation

## 2015-12-31 NOTE — Therapy (Signed)
[facility] [address] [place], [place], [address] Phone: [phone]   Fax:  [phone]  Physical Therapy Treatment  Patient Details  Name: Brad HomansDavid A Bier MRN: [id number] Date of Birth: February 27, 1966 Referring Provider: Georgianne Fickamachandran, Ajith  Encounter Date: 12/31/2015      PT End of Session - 12/31/15 1129    Visit Number 3   Number of Visits 9   Date for PT Re-Evaluation 01/23/16   Authorization Type BCBS   PT Start Time 0844   PT Stop Time 0931   PT Time Calculation (min) 47 min      Past Medical History  Diagnosis Date  . Hypertension     Past Surgical History  Procedure Laterality Date  . Hernia repair      There were no vitals filed for this visit.  Visit Diagnosis:  Dizziness and giddiness  Unsteadiness      Subjective Assessment - 12/31/15 0846    Subjective "I feel more wobbly today." HEP going well. Pt reporting "ringing in left ear" x2 minutes duration when rolling in bed last night. Pt with many questions today about how different factors may be impacting dizziness. Pre-session dizziness: 6/10; post-session: 1/10.   Patient Stated Goals "I would like to be more balanced and get back to exercising."   Currently in Pain? No/denies                Vestibular Assessment - 12/31/15 0001    Vestibulo-Occular Reflex   VOR 1 Head Only (x 1 viewing) X1 with horizontal head turns: slow head movement required to keep target in focus. With vertical head turns:slow head movement; lost target x1 during 30-sec trial.    Positional Sensitivities   Rolling Right Severe dizziness   Rolling Left Moderate dizziness                  Vestibular Treatment/Exercise - 12/31/15 0001    Vestibular Treatment/Exercise   Vestibular Treatment Provided Habituation;Gaze   Habituation Exercises Horizontal Roll   Gaze Exercises X1 Viewing Horizontal;X1 Viewing Vertical   Horizontal Roll   Number of  Reps  5   Symptom Description  Within 5  trials, symptoms decreased from 4/5 to 3/5 with rolling R, and from 3/5 to 1/5 with rolling L.   X1 Viewing Horizontal   Foot Position standing; shoulder width   Reps 2   Comments x30 seconds with glasses on; x30 seconds with glasses off   X1 Viewing Vertical   Foot Position standing; shoulder width   Reps 2   Comments x30 seconds with glasses on; x30 seconds with glasses off            Balance Exercises - 12/31/15 1126    Balance Exercises: Standing   Standing Eyes Opened Head turns;Foam/compliant surface;5 reps  1 pillow; head turns x5 horizontal and vertical   Standing Eyes Closed Wide (BOA);Head turns;Foam/compliant surface;5 reps  1 pillow; head turns x5 horizontal and vertical           PT Education - 12/31/15 1115    Education provided Yes   Education Details HEP progressed; see Pt Instructions for details.  Per pt questions, educated pt on how different factors (wearing headphones, anxiety) are/aren't likely to impact pt-perceived dizziness.   Person(s) Educated Patient   Methods Explanation;Demonstration;Handout;Verbal cues   Comprehension Verbalized understanding;Returned demonstration          PT Short Term Goals - 12/24/15 1305    PT SHORT  TERM GOAL #1   Title STGs=LTGs           PT Long Term Goals - 12/28/15 1322    PT LONG TERM GOAL #1   Title Pt will verbalize understanding of BPPV and the role it plays in vertigo/dizziness. TARGET DATE = 01/23/16   Time 4   Period Weeks   Status On-going   PT LONG TERM GOAL #2   Title The patient will improve SOT vestibular score to WNL of age related norms to demonstrate improved use of vestibular input for balance.   Baseline SOT vestibular = 30% on 12/28/15, SOT composite = 65   Time 4   Period Weeks   Status New   PT LONG TERM GOAL #3   Title The patient will improve FGA to 30/30 to demonstrate improvements in functional gait with dynamic activities.   Baseline  FGA = 25/30 on 12/28/15   Time 4   Period Weeks   Status New   PT LONG TERM GOAL #4   Title The patient will report symptoms of dizziness/light headedness 0/10 with horizontal rolling R x5 reps to demonstrate increased tolerance to positional changes.   Time 4   Period Weeks   Status On-going   PT LONG TERM GOAL #5   Title The patient will improve DHI from 48 to 32 to demonstrate improvements in dizziness and function.   Baseline DHI = 48   Time 4   Period Weeks   Status On-going               Plan - 12/31/15 1130    Clinical Impression Statement Skilled session focused on increasing pt reliance on vestibular input, decreasing motion sensitivity, improving VOR. Pt continues to note symptoms with horizontal rolling; however, symptoms improved markedly after habituation.   Pt will benefit from skilled therapeutic intervention in order to improve on the following deficits Decreased balance;Dizziness   Rehab Potential Good   Clinical Impairments Affecting Rehab Potential Young age. Potential barriers include increased anxiety   PT Frequency 2x / week   PT Duration 4 weeks   PT Treatment/Interventions Canalith Repostioning;Gait training;Stair training;Therapeutic activities;Therapeutic exercise;Balance training;Neuromuscular re-education;Patient/family education;Manual techniques;Vestibular;DME Instruction   PT Next Visit Plan Review HEP.  Standing balance with increased reliance on vestibular input. Discuss gym program/activity levels with patient.   PT Home Exercise Plan Standing wide base eyes closed on compliant surface, standing wide base eyes open vertical/horizontal head turns compliant surface   Consulted and Agree with Plan of Care Patient        Problem List There are no active problems to display for this patient.  [name], PT, DPT [facility] [address] [place], [place], [address] Phone: [phone]   Fax:   [phone] 12/31/2015, 11:41 AM   Name: Brad Henry MRN: [id number] Date of Birth: 1965-11-04

## 2015-12-31 NOTE — Patient Instructions (Addendum)
Tip Card 1.The goal of habituation training is to assist in decreasing symptoms of vertigo, dizziness, or nausea provoked by specific head and body motions. 2.These exercises may initially increase symptoms; however, be persistent and work through symptoms. With repetition and time, the exercises will assist in reducing or eliminating symptoms. 3.Exercises should be stopped and discussed with the therapist if you experience any of the following: - Sudden change or fluctuation in hearing - New onset of ringing in the ears, or increase in current intensity - Any fluid discharge from the ear - Severe pain in neck or back - Extreme nausea  Copyright  VHI. All rights reserved.  Rolling   With pillow under head, start on back. Roll to your right side. Hold until dizziness stops, plus 20 seconds and then roll to the left side. Hold until dizziness stops, plus 20 seconds. Repeat sequence 5 times per session. Do 2 sessions per day.   Feet Apart (Compliant Surface) Head Motion - Eyes Open    With eyes open, standing on compliant surface: __1 pillow______, feet shoulder width apart, move head slowly: up and down 5 times. Let symptoms settle. Then turn head side to side 5 times.  Repeat _ 2 sets per session. Do _2___ sessions per day.  Feet Apart (Compliant Surface) Head Motion - Eyes Closed    Stand on compliant surface: __1 pillow______ with feet shoulder width apart. Close eyes and move head slowly, up and down 5 times; right to left 5 times. Repeat __2__ times per session. Do __2__ sessions per day.  Gaze Stabilization: Tip Card  1.Target ("A") must remain in focus, not blurry, and appear stationary while head is in motion. 2.Perform exercises with small head movements (45 to either side of midline). 3.Increase speed of head motion so long as target is in focus. 4.If you wear eyeglasses, be sure you can see target through lens (therapist will give specific instructions for bifocal /  progressive lenses). 5.These exercises may provoke dizziness or nausea. Work through these symptoms. If too dizzy, slow head movement slightly. Rest between each exercise. 6.Exercises demand concentration; avoid distractions. 7.For safety, perform standing exercises close to a counter, wall, corner, or next to someone.  Gaze Stabilization: Standing Feet Apart    Feet shoulder width apart, keeping eyes on target on wall 5-6 feet awar, tilt head down 15-30 and move head side to side for _30___ seconds. Repeat while moving head up and down for _30___ seconds. Do __2__ sessions per day with glasses on, and 2 times per day with glasses off.

## 2016-01-04 ENCOUNTER — Ambulatory Visit: Payer: BLUE CROSS/BLUE SHIELD | Admitting: Rehabilitative and Restorative Service Providers"

## 2016-01-04 DIAGNOSIS — R42 Dizziness and giddiness: Secondary | ICD-10-CM | POA: Diagnosis not present

## 2016-01-04 DIAGNOSIS — R2681 Unsteadiness on feet: Secondary | ICD-10-CM

## 2016-01-04 NOTE — Patient Instructions (Signed)
Weight Shift: Anterior / Posterior (Limits of Stability)    Slowly shift weight backward to lean hips and upper back against the wall.  Bring hips off of the wall and then upper back. Do this with eyes closed and standing on a soft surface (pillow).  Repeat __10__ times per session. Do ___2_ sessions per day.  Copyright  VHI. All rights reserved.

## 2016-01-04 NOTE — Therapy (Signed)
[facility] [address] [place], [place], [address] Phone: [phone]   Fax:  [phone]  Physical Therapy Treatment  Patient Details  Name: NAHZIR POHLE MRN: [id number] Date of Birth: Nov 19, 1965 Referring Provider: Georgianne Fick  Encounter Date: 01/04/2016      PT End of Session - 01/04/16 0901    Visit Number 4   Number of Visits 9   Date for PT Re-Evaluation 01/23/16   Authorization Type BCBS   PT Start Time 0800   PT Stop Time 0848   PT Time Calculation (min) 48 min   Equipment Utilized During Treatment Gait belt   Activity Tolerance Patient tolerated treatment well   Behavior During Therapy [facility] for tasks assessed/performed      Past Medical History  Diagnosis Date  . Hypertension     Past Surgical History  Procedure Laterality Date  . Hernia repair      There were no vitals filed for this visit.  Visit Diagnosis:  Dizziness and giddiness  Unsteadiness      Subjective Assessment - 01/04/16 0802    Subjective Pt arrives today stating that he feels "a little off balance" today. Says he had a "rough weekend" as he had to take his mother to the hospital because she "passed out in church." Pt reports he has been compliant with HEP. Says he does some gaze exercises at lunch break at work. Pt reports dizziness at 4-5/10 at beginning of today's session.    Patient Stated Goals "I would like to be more balanced and get back to exercising."   Currently in Pain? No/denies          Vestibular Assessment - 01/04/16 0001    Horizontal Canal Right   Horizontal Canal Right Symptoms Normal;Other (comment)  Pt reports symptoms of dizziness   Horizontal Canal Left   Horizontal Canal Left Symptoms Normal          [facility] Adult PT Treatment/Exercise - 01/04/16 0001    Self-Care   Self-Care Other Self-Care Comments   Other Self-Care Comments  Discussed increasing activity levels including returning  to gym routine with patient. Patient instructed to return to walking on treadmill/elliptical. Pt also instructed to not slow gait speed during daily routine as this can cause unnecessary/unwanted compensations secondary to vestibular dysfunction         Vestibular Treatment/Exercise - 01/04/16 0001    Vestibular Treatment/Exercise   Vestibular Treatment Provided Habituation;Canalith Repositioning   Canalith Repositioning Canal Roll Right   Habituation Exercises Horizontal Roll   Gaze Exercises X1 Viewing Horizontal;X1 Viewing Vertical   Canal Roll Right   Number of Reps  1   Overall Response  No change   Response Details  Treated based on patient's symptoms. Pt reports minimal to no change in symptoms with repositioning.   Horizontal Roll   Number of Reps  5   Symptom Description  Pt instructed to move through full horizontal roll L<>R with no pause in supine. Pt instructed to maintain position until symptoms subsided before moving to next position.   X1 Viewing Horizontal   Foot Position standing; shoulder width   Reps 3   Comments x30 sec with glasses on. pt instructed verbally and manually to increase speed of head turns   X1 Viewing Vertical   Foot Position standing; shoulder width   Reps 3   Comments x30 seconds with glasses off. Pt instructed to increase speed of head turns.  Balance Exercises - 01/04/16 0855    Balance Exercises: Standing   Standing Eyes Opened Head turns;Foam/compliant surface;5 reps   Standing Eyes Closed Head turns;Foam/compliant surface;Wide (BOA);3 reps;30 secs;Narrow base of support (BOS)  wide base with head turns EC , narrow base EC on foam   Wall Bumps Hip   Wall Bumps-Hips Eyes opened;Eyes closed;5 reps;Other (comment)  x5 reps EO firm, EC firm, EO foam, EC foam           PT Education - 01/04/16 0900    Education provided Yes   Education Details HEP progress, please see pt instructions for details. Pt instructed to return to  normal daily activities including gym routine. Pt instructed return to exercise not likely to inc risk for CVA.   Person(s) Educated Patient   Methods Explanation;Demonstration;Tactile cues;Verbal cues;Handout   Comprehension Verbalized understanding;Returned demonstration;Verbal cues required;Tactile cues required          PT Short Term Goals - 12/24/15 1305    PT SHORT TERM GOAL #1   Title STGs=LTGs           PT Long Term Goals - 12/28/15 1322    PT LONG TERM GOAL #1   Title Pt will verbalize understanding of BPPV and the role it plays in vertigo/dizziness. TARGET DATE = 01/23/16   Time 4   Period Weeks   Status On-going   PT LONG TERM GOAL #2   Title The patient will improve SOT vestibular score to WNL of age related norms to demonstrate improved use of vestibular input for balance.   Baseline SOT vestibular = 30% on 12/28/15, SOT composite = 65   Time 4   Period Weeks   Status New   PT LONG TERM GOAL #3   Title The patient will improve FGA to 30/30 to demonstrate improvements in functional gait with dynamic activities.   Baseline FGA = 25/30 on 12/28/15   Time 4   Period Weeks   Status New   PT LONG TERM GOAL #4   Title The patient will report symptoms of dizziness/light headedness 0/10 with horizontal rolling R x5 reps to demonstrate increased tolerance to positional changes.   Time 4   Period Weeks   Status On-going   PT LONG TERM GOAL #5   Title The patient will improve DHI from 48 to 32 to demonstrate improvements in dizziness and function.   Baseline DHI = 48   Time 4   Period Weeks   Status On-going           Plan - 01/04/16 0901    Clinical Impression Statement Today's skilled session focused on increasing vestibular input, improving habituation to horizontal rolling, improving use of hip strategy, progressing VOR, and encouraging return to regular activities. The patient continues to report symptoms during the night when he is sitting and laying in bed.  Continue per POC.   Pt will benefit from skilled therapeutic intervention in order to improve on the following deficits Decreased balance;Dizziness   Rehab Potential Good   Clinical Impairments Affecting Rehab Potential Young age. Potential barriers include increased anxiety   PT Frequency 2x / week   PT Duration 4 weeks   PT Treatment/Interventions Canalith Repostioning;Gait training;Stair training;Therapeutic activities;Therapeutic exercise;Balance training;Neuromuscular re-education;Patient/family education;Manual techniques;Vestibular;DME Instruction   PT Next Visit Plan Review HEP and progress. Static/dynamic vestibular training. Inquire about return to gym.   PT Home Exercise Plan Narrow base EC on foam, Wide base EC with vertical/horizontal head turns, wall bumps EC on  foam   Consulted and Agree with Plan of Care Patient        Problem List There are no active problems to display for this patient.   Celene Squibb, SPT 01/04/2016, 9:12 AM  [facility] [address] [place], [place], [address] Phone: [phone]   Fax:  [phone]  Name: JERL MUNYAN MRN: [id number] Date of Birth: 10-09-1966

## 2016-01-07 ENCOUNTER — Ambulatory Visit: Payer: BLUE CROSS/BLUE SHIELD | Admitting: Rehabilitative and Restorative Service Providers"

## 2016-01-07 DIAGNOSIS — R42 Dizziness and giddiness: Secondary | ICD-10-CM | POA: Diagnosis not present

## 2016-01-07 DIAGNOSIS — R2681 Unsteadiness on feet: Secondary | ICD-10-CM

## 2016-01-07 NOTE — Patient Instructions (Signed)
Gaze Stabilization: Tip Card  1.Target must remain in focus, not blurry, and appear stationary while head is in motion. 2.Perform exercises with small head movements (45 to either side of midline). 3.Increase speed of head motion so long as target is in focus. 4.If you wear eyeglasses, be sure you can see target through lens (therapist will give specific instructions for bifocal / progressive lenses). 5.These exercises may provoke dizziness or nausea. Work through these symptoms. If too dizzy, slow head movement slightly. Rest between each exercise. 6.Exercises demand concentration; avoid distractions. 7.For safety, perform standing exercises close to a counter, wall, corner, or next to someone.  Copyright  VHI. All rights reserved.  Gaze Stabilization: Standing Feet Together    Feet together, keeping eyes on target on wall 3-5 feet away, tilt head down slightly and move head side to side (Glasses ON) for 60 seconds. Repeat while moving head up and down (NO glasses) for _60___ seconds. Do __3__ sessions per day.  Copyright  VHI. All rights reserved.  Visuo-Vestibular: Head / Eyes Moving in Opposite Direction    Holding a target, keep eyes on target and slowly move target side to side while moving head in OPPOSITE direction of target.  Repeat 10 times. Perform sitting. Do __2__ sessions per day.  Copyright  VHI. All rights reserved.   Feet Together (Compliant Surface) Varied Arm Positions - Eyes Closed    Stand on compliant surface: ___pillow_____ with feet together and arms at your sides. Close eyes and visualize upright position. Hold__30__ seconds. Repeat __3__ times per session. Do __2__ sessions per day.  Copyright  VHI. All rights reserved.  Feet Together (Compliant Surface) Head Motion - Eyes Open    With eyes open, standing on compliant surface: __pillow______, feet together, move head *quickly side to side. Repeat __10__ times per session. Do __2__ sessions per  day.  Copyright  VHI. All rights reserved.  Turning    Tilt head down 15-30, lead with head and eyes and slowly make FULL turn to left with eyes open. Spot an object until symptoms subside. Repeat 5 times to each side resting in between every rep.  Do _2___ sessions per day. Stand near countertop for support. Copyright  VHI. All rights reserved.   Side to Side Head Motion While Multi-Tasking    Perform without assistive device. Walk on solid surface, turn head and eyes to left for __2__ steps. Then, turn head and eyes straight ahead for __2__ steps. Then, turn head and eyes to opposite side for _2___ steps. Repeat sequence _10___ times per session. Do __2__ sessions per day.  Copyright  VHI. All rights reserved.

## 2016-01-07 NOTE — Therapy (Signed)
[facility] [address] [place], [place], [address] Phone: [phone]   Fax:  [phone]  Physical Therapy Treatment  Patient Details  Name: Brad Henry MRN: [id number] Date of Birth: Mar 12, 1966 Referring Provider: Georgianne Fick  Encounter Date: 01/07/2016      PT End of Session - 01/07/16 0805    Visit Number 5   Number of Visits 9   Date for PT Re-Evaluation 01/23/16   Authorization Type BCBS   PT Start Time 0803   PT Stop Time 0845   PT Time Calculation (min) 42 min   Equipment Utilized During Treatment Gait belt   Activity Tolerance Patient tolerated treatment well   Behavior During Therapy [facility] for tasks assessed/performed      Past Medical History  Diagnosis Date  . Hypertension     Past Surgical History  Procedure Laterality Date  . Hernia repair      There were no vitals filed for this visit.  Visit Diagnosis:  Dizziness and giddiness  Unsteadiness      Subjective Assessment - 01/07/16 0801    Subjective "It's been difficult this week."  Patient notes dizziness 1 episode x 2-3 minutes.  He feels more off balance when he first gets up and this gradually wears off t/o the day and returns in the late evening.  Patient brought pillow from home to determine if it is apropriate.     Patient Stated Goals "I would like to be more balanced and get back to exercising."   Currently in Pain? No/denies            Vestibular Treatment/Exercise - 01/07/16 0934    Vestibular Treatment/Exercise   Vestibular Treatment Provided Habituation;Gaze   Habituation Exercises Standing Horizontal Head Turns;Standing Vertical Head Turns;360 degree Turns   Gaze Exercises X1 Viewing Horizontal;X1 Viewing Vertical;Eye/Head Exercise Horizontal   Standing Horizontal Head Turns   Number of Reps  10   Symptom Description  on compliant surfaces with feet together working on increasing speed of motion   Standing Vertical Head Turns   Number of Reps  10   Symptom Description  on compliant surfaces with feet together   360 degree Turns   Number of Reps  5   Symptom Description  increases sensations of dizziness and unsteadiness   COMMENT patient performs with rest in between each repetition   X1 Viewing Horizontal   Foot Position standing feet together and progressing to partial heel/toe   Time --   60 seconds   Comments increased duration and progressed foot position, glasses on horizontal and glasses off vertical    X1 Viewing Vertical   Foot Position standing   Reps --  60 seconds   Eye/Head Exercise Horizontal   Foot Position seated   Reps 10   Comments x 2 viewing with cues on technique      Gait: Walking with head turns side to side every 2 steps x 230 feet x 3 times.   NEUROMUSCULAR RE-EDUCATION: Single leg stance with ball toss with improvement with repetition. See above  For other NMR    SELF CARE/HOME MANAGEMENT: Discussed home walking, gym exercises, progression of HEP      PT Education - 01/07/16 0920    Education provided Yes   Education Details HEP: updated difficulty to have gaze x 1 x 60 seconds, partial heel/toe gaze, feet together + eyes closed compliant surface, feet together + head turns compliant surface, gait with head turns, x  2 viewing and 360 degree turns.   Person(s) Educated Patient   Methods Explanation;Demonstration;Handout   Comprehension Returned demonstration;Verbalized understanding          PT Short Term Goals - 12/24/15 1305    PT SHORT TERM GOAL #1   Title STGs=LTGs           PT Long Term Goals - 12/28/15 1322    PT LONG TERM GOAL #1   Title Pt will verbalize understanding of BPPV and the role it plays in vertigo/dizziness. TARGET DATE = 01/23/16   Time 4   Period Weeks   Status On-going   PT LONG TERM GOAL #2   Title The patient will improve SOT vestibular score to WNL of age related norms to demonstrate improved use of  vestibular input for balance.   Baseline SOT vestibular = 30% on 12/28/15, SOT composite = 65   Time 4   Period Weeks   Status New   PT LONG TERM GOAL #3   Title The patient will improve FGA to 30/30 to demonstrate improvements in functional gait with dynamic activities.   Baseline FGA = 25/30 on 12/28/15   Time 4   Period Weeks   Status New   PT LONG TERM GOAL #4   Title The patient will report symptoms of dizziness/light headedness 0/10 with horizontal rolling R x5 reps to demonstrate increased tolerance to positional changes.   Time 4   Period Weeks   Status On-going   PT LONG TERM GOAL #5   Title The patient will improve DHI from 48 to 32 to demonstrate improvements in dizziness and function.   Baseline DHI = 48   Time 4   Period Weeks   Status On-going               Plan - 01/07/16 [id number]    Clinical Impression Statement The patient is continuing to limit his mobility/activities for reintegration into community exercise due to fear of increasing symptoms.  PT provided more challenging HEP and education to focus on moving enough to feel symptoms in order to get his vestibular system to adapt.     PT Next Visit Plan Review HEP and progress to tolerance, gaze x 1 with narrowing base, gaze x 2, dynamic gait activities with eye/head coordination.   Consulted and Agree with Plan of Care Patient        Problem List There are no active problems to display for this patient.   Jomarie Gellis, PT 01/07/2016, 9:42 AM  [facility] [address] [place], [place], [address] Phone: [phone]   Fax:  [phone]  Name: Brad Henry MRN: [id number] Date of Birth: 1966-09-03

## 2016-01-11 ENCOUNTER — Encounter: Payer: BLUE CROSS/BLUE SHIELD | Admitting: Physical Therapy

## 2016-01-14 ENCOUNTER — Ambulatory Visit: Payer: BLUE CROSS/BLUE SHIELD | Admitting: Rehabilitative and Restorative Service Providers"

## 2016-01-14 DIAGNOSIS — R2681 Unsteadiness on feet: Secondary | ICD-10-CM

## 2016-01-14 DIAGNOSIS — R42 Dizziness and giddiness: Secondary | ICD-10-CM

## 2016-01-14 NOTE — Patient Instructions (Signed)
At the gym: Begin with 25% of what you typically do.  Only move up in intensity as you are able to tolerate.  Perform exercises 1x/day and space out as needed For HEP: Continue current program with small changes- 1) for full turns, be sure to stop in the middle and let dizziness settle, then repeat 2) when performing head turns while focusing on letter, make sure you turn head to L and R 45 degrees equally 3) when performing head turns on the foam, make sure the head and eyes move together

## 2016-01-14 NOTE — Therapy (Signed)
[facility] [address] [place], [place], [address] Phone: [phone]   Fax:  [phone]  Physical Therapy Treatment  Patient Details  Name: Brad HomansDavid A Henry MRN: [id number] Date of Birth: 12-Feb-1966 Referring Provider: Georgianne Fickamachandran, Ajith  Encounter Date: 01/14/2016      PT End of Session - 01/14/16 0945    Visit Number 6   Number of Visits 9   Date for PT Re-Evaluation 01/23/16   Authorization Type BCBS   PT Start Time 0801   PT Stop Time 0848   PT Time Calculation (min) 47 min   Equipment Utilized During Treatment Gait belt   Activity Tolerance Patient tolerated treatment well   Behavior During Therapy [facility] for tasks assessed/performed      Past Medical History  Diagnosis Date  . Hypertension     Past Surgical History  Procedure Laterality Date  . Hernia repair      There were no vitals filed for this visit.  Visit Diagnosis:  Dizziness and giddiness  Unsteadiness      Subjective Assessment - 01/14/16 0802    Subjective Pt arrives today stating that he has felt "off balance" this week and today. States he started new exercises and says he feels unstable. Pt reports he was having difficulty sleeping in his bed last night because he became nauseous as he turned in bed. Pt reports he had to use his cane last night.   Patient Stated Goals "I would like to be more balanced and get back to exercising."   Currently in Pain? No/denies          Vestibular Assessment - 01/14/16 0001    Symptom Behavior   Type of Dizziness Comment  Wobbly   Frequency of Dizziness --  "wobbyl"   Aggravating Factors Rolling to right   Dix-Hallpike Right   Dix-Hallpike Right Symptoms No nystagmus   Dix-Hallpike Left   Dix-Hallpike Left Symptoms No nystagmus   Horizontal Canal Right   Horizontal Canal Right Symptoms Normal   Horizontal Canal Left   Horizontal Canal Left Symptoms Normal          Vestibular  Treatment/Exercise - 01/14/16 0805    Vestibular Treatment/Exercise   Vestibular Treatment Provided Habituation;Gaze   Habituation Exercises Horizontal Roll;360 degree Turns   Gaze Exercises X1 Viewing Horizontal;X1 Viewing Vertical   Horizontal Roll   Number of Reps  3   Symptom Description  Pt performed horizontal rolling with 2 pillows and subjective reports of dizziness 7/10. Pt states it is worse with R rolling.   Standing Horizontal Head Turns   Symptom Description  on compliant surfaces with feet together working on increasing speed of motion. Verbal cues to move head/eyes together.   Standing Vertical Head Turns   Symptom Description  on compliant surfaces with feet together. Verbal cues to move head/eyes together.   360 degree Turns   Number of Reps  5   COMMENT Performed 360 turns to L/R. Verbal cues for pt to let dizziness settle/subside before next repetition.   X1 Viewing Horizontal   Foot Position Standing feet together eyes open   Comments 2x30 sec on compliant surfaces with feet together working on increasing speed of motion. Verbal cues to inc amplitude of movement to L.   X1 Viewing Vertical   Comments 2x30 sec on compliant surfaces with feet together working on increasing speed of motion.            PT Education - 01/14/16 [id number]  Education provided Yes   Education Details HEP modified/updated - please see pt instructions. Pt advised to return to gym at 25% of previous level and to monitor symptoms before returning to full previous level. Pt advised to begin with elliptical/treadmill walking and not to jump rope as this may cause excess vertical oscillation.   Person(s) Educated Patient   Methods Explanation;Demonstration;Tactile cues;Verbal cues;Handout   Comprehension Verbalized understanding;Returned demonstration;Verbal cues required;Tactile cues required          PT Short Term Goals - 12/24/15 1305    PT SHORT TERM GOAL #1   Title STGs=LTGs            PT Long Term Goals - 12/28/15 1322    PT LONG TERM GOAL #1   Title Pt will verbalize understanding of BPPV and the role it plays in vertigo/dizziness. TARGET DATE = 01/23/16   Time 4   Period Weeks   Status On-going   PT LONG TERM GOAL #2   Title The patient will improve SOT vestibular score to WNL of age related norms to demonstrate improved use of vestibular input for balance.   Baseline SOT vestibular = 30% on 12/28/15, SOT composite = 65   Time 4   Period Weeks   Status New   PT LONG TERM GOAL #3   Title The patient will improve FGA to 30/30 to demonstrate improvements in functional gait with dynamic activities.   Baseline FGA = 25/30 on 12/28/15   Time 4   Period Weeks   Status New   PT LONG TERM GOAL #4   Title The patient will report symptoms of dizziness/light headedness 0/10 with horizontal rolling R x5 reps to demonstrate increased tolerance to positional changes.   Time 4   Period Weeks   Status On-going   PT LONG TERM GOAL #5   Title The patient will improve DHI from 48 to 32 to demonstrate improvements in dizziness and function.   Baseline DHI = 48   Time 4   Period Weeks   Status On-going           Plan - 01/14/16 0945    Clinical Impression Statement The patient's HEP was reviewed and modified to allow for symptoms to subside secondary to inc dizziness since last session. Pt instructed to increase activity level at home, including returning to light aerobic activities at the gym to begin return to prior activity level and allow for vestibular adaptation.   Pt will benefit from skilled therapeutic intervention in order to improve on the following deficits Decreased balance;Dizziness   Rehab Potential Good   Clinical Impairments Affecting Rehab Potential Young age. Potential barriers include increased anxiety   PT Frequency 2x / week   PT Duration 4 weeks   PT Treatment/Interventions Canalith Repostioning;Gait training;Stair training;Therapeutic  activities;Therapeutic exercise;Balance training;Neuromuscular re-education;Patient/family education;Manual techniques;Vestibular;DME Instruction   PT Next Visit Plan Begin checking goals - due 01/23/16. Review/progress HEP as tolerated. Discuss gym activities. Vestibular rehabilitation - 360 turns, dynamic gait activities with eye/head coordination.   PT Home Exercise Plan Please see pt instructions.   Consulted and Agree with Plan of Care Patient        Problem List There are no active problems to display for this patient.   Celene Squibb, SPT 01/14/2016, 9:49 AM  [facility] [address] [place], [place], [address] Phone: [phone]   Fax:  [phone]  Name: Brad Henry MRN: [id number] Date of Birth: November 26, 1965

## 2016-01-18 ENCOUNTER — Ambulatory Visit: Payer: BLUE CROSS/BLUE SHIELD | Admitting: Rehabilitative and Restorative Service Providers"

## 2016-01-18 DIAGNOSIS — R42 Dizziness and giddiness: Secondary | ICD-10-CM

## 2016-01-18 DIAGNOSIS — R2681 Unsteadiness on feet: Secondary | ICD-10-CM

## 2016-01-18 NOTE — Patient Instructions (Signed)
LOOK FOR "STABILITY" SHOES THE NEXT TIME YOU REPLACE WORKOUT SHOES (will provide a more rigid sole)   Turning in Place: Solid Surface   Standing in place, lead with head and turn slowly making 1/2 turns toward left.  Hold until dizziness stops, then move back to the right. Repeat _5___ times per session. Do __2__ sessions per day.  Copyright  VHI. All rights reserved.

## 2016-01-18 NOTE — Therapy (Signed)
[facility] [address] [place], [place], [address] Phone: [phone]   Fax:  [phone]  Physical Therapy Treatment  Patient Details  Name: Brad HomansDavid A Henry MRN: [id number] Date of Birth: August 02, 1966 Referring Provider: Georgianne Fickamachandran, Ajith  Encounter Date: 01/18/2016      PT End of Session - 01/18/16 0838    Visit Number 7   Number of Visits 9   Date for PT Re-Evaluation 01/23/16   Authorization Type BCBS   PT Start Time 0800   PT Stop Time 0840   PT Time Calculation (min) 40 min   Equipment Utilized During Treatment Gait belt   Activity Tolerance Patient tolerated treatment well   Behavior During Therapy [facility] for tasks assessed/performed      Past Medical History  Diagnosis Date  . Hypertension     Past Surgical History  Procedure Laterality Date  . Hernia repair      There were no vitals filed for this visit.  Visit Diagnosis:  Dizziness and giddiness  Unsteadiness      Subjective Assessment - 01/18/16 0757    Subjective The patient reports that he is still getting significant dizziness during 360 degree turns.  He tried turns on Sunday and felt that it is "setting something off" when he performs.  He felt that symptoms lasted on Sunday and Monday after performing that HEP (turns).  Other exercises are going well.  He has not yet returned to the gym as planned.  He has walked for exercise.     Patient Stated Goals "I would like to be more balanced and get back to exercising."   Currently in Pain? No/denies            [facility] PT Assessment - 01/18/16 0819    Functional Gait  Assessment   Gait assessed  Yes   Gait Level Surface Walks 20 ft in less than 5.5 sec, no assistive devices, good speed, no evidence for imbalance, normal gait pattern, deviates no more than 6 in outside of the 12 in walkway width.   Change in Gait Speed Able to smoothly change walking speed without loss of balance or gait  deviation. Deviate no more than 6 in outside of the 12 in walkway width.   Gait with Horizontal Head Turns Performs head turns smoothly with no change in gait. Deviates no more than 6 in outside 12 in walkway width   Gait with Vertical Head Turns Performs head turns with no change in gait. Deviates no more than 6 in outside 12 in walkway width.   Gait and Pivot Turn Pivot turns safely within 3 sec and stops quickly with no loss of balance.   Step Over Obstacle Is able to step over 2 stacked shoe boxes taped together (9 in total height) without changing gait speed. No evidence of imbalance.   Gait with Narrow Base of Support Is able to ambulate for 10 steps heel to toe with no staggering.   Gait with Eyes Closed Walks 20 ft, uses assistive device, slower speed, mild gait deviations, deviates 6-10 in outside 12 in walkway width. Ambulates 20 ft in less than 9 sec but greater than 7 sec.   Ambulating Backwards Walks 20 ft, no assistive devices, good speed, no evidence for imbalance, normal gait   Steps Alternating feet, no rail.   Total Score 29   FGA comment: Improved ot 29/30  [facility] Adult PT Treatment/Exercise - 01/18/16 0804    Self-Care   Self-Care Other Self-Care Comments   Other Self-Care Comments  Discussed return to gym routine and continue to encourage reintegration into community activities.   Neuro Re-ed    Neuro Re-ed Details  Sensory organization testing=69% (1% below normal for age/height).  The patient scored WNLs on use of somatosensory systems, visual system, and vestibular system.  Preferential use is slightly diminished.  Performed review of turns for HEP and modiifed to 180 degree turns.                  PT Education - 01/18/16 1011    Education provided Yes   Education Details modified 360 degree turns to 1/2 turn   Person(s) Educated Patient   Methods Explanation;Demonstration;Handout   Comprehension Verbalized  understanding;Returned demonstration          PT Short Term Goals - 12/24/15 1305    PT SHORT TERM GOAL #1   Title STGs=LTGs           PT Long Term Goals - 01/18/16 0819    PT LONG TERM GOAL #1   Title Pt will verbalize understanding of BPPV and the role it plays in vertigo/dizziness. TARGET DATE = 01/23/16   Time 4   Period Weeks   Status On-going   PT LONG TERM GOAL #2   Title The patient will improve SOT vestibular score to WNL of age related norms to demonstrate improved use of vestibular input for balance.   Baseline SOT vestibular = 30% on 12/28/15, SOT composite = 65   Time 4   Period Weeks   Status On-going   PT LONG TERM GOAL #3   Title The patient will improve FGA to 30/30 to demonstrate improvements in functional gait with dynamic activities.   Baseline FGA = 25/30 on 12/28/15   Time 4   Period Weeks   Status On-going   PT LONG TERM GOAL #4   Title The patient will report symptoms of dizziness/light headedness 0/10 with horizontal rolling R x5 reps to demonstrate increased tolerance to positional changes.   Time 4   Period Weeks   Status On-going   PT LONG TERM GOAL #5   Title The patient will improve DHI from 48 to 32 to demonstrate improvements in dizziness and function.   Baseline DHI = 48   Time 4   Period Weeks   Status On-going               Plan - 01/18/16 1012    Clinical Impression Statement The patient continues to self limit his return to prior activities.  He reports stress and anxiety appear to contribute to symptoms.  PT is encouraging patient to continue progressing to tolerance with HEP and return to gym activities.  Reduced frequency to 1x/week as patitent has appropriate HEP.   PT Next Visit Plan Check LTGs due 01/23/16.  Review HEP and progress.   Consulted and Agree with Plan of Care Patient        Problem List There are no active problems to display for this patient.   Emmanuelle Coxe, PT 01/18/2016, 2:40 PM  [facility] [address] [place], [place], [address] Phone: [phone]   Fax:  [phone]  Name: Brad Henry MRN: [id number] Date of Birth: [birth year]

## 2016-01-21 ENCOUNTER — Encounter: Payer: BLUE CROSS/BLUE SHIELD | Admitting: Rehabilitative and Restorative Service Providers"

## 2016-01-25 ENCOUNTER — Ambulatory Visit: Payer: BLUE CROSS/BLUE SHIELD | Admitting: Rehabilitative and Restorative Service Providers"

## 2016-01-25 DIAGNOSIS — R42 Dizziness and giddiness: Secondary | ICD-10-CM | POA: Diagnosis not present

## 2016-01-25 DIAGNOSIS — R2689 Other abnormalities of gait and mobility: Secondary | ICD-10-CM

## 2016-01-25 NOTE — Patient Instructions (Signed)
Gaze Stabilization: Tip Card  1.Target must remain in focus, not blurry, and appear stationary while head is in motion. 2.Perform exercises with small head movements (45 to either side of midline). 3.Increase speed of head motion so long as target is in focus. 4.If you wear eyeglasses, be sure you can see target through lens (therapist will give specific instructions for bifocal / progressive lenses). 5.These exercises may provoke dizziness or nausea. Work through these symptoms. If too dizzy, slow head movement slightly. Rest between each exercise. 6.Exercises demand concentration; avoid distractions. 7.For safety, perform standing exercises close to a counter, wall, corner, or next to someone.  Copyright  VHI. All rights reserved.   Gaze Stabilization: Standing Feet Heel-Toe "Tandem"    Feet in full heel-toe position, keeping eyes on target on wall __3__ feet away, tilt head down slightly and move head side to side for __30__ seconds, working up to 60 seconds.. Repeat while moving head up and down for __30__ seconds, working up to 60 seconds. Do _2___ sessions per day.  Copyright  VHI. All rights reserved.

## 2016-01-27 NOTE — Therapy (Signed)
[place] [address] [place], [place], [address] Phone: [phone]   Fax:  [phone]  Physical Therapy Treatment  Patient Details  Name: Brad Henry MRN: [id number] Date of Birth: 1966-07-29 Referring Provider: Merrilee Seashore  Encounter Date: 01/25/2016      PT End of Session - 01/27/16 1311    Visit Number 8   Number of Visits 9   Date for PT Re-Evaluation 01/23/16   Authorization Type BCBS   PT Start Time 0803   PT Stop Time 0842   PT Time Calculation (min) 39 min   Activity Tolerance Patient tolerated treatment well   Behavior During Therapy [facility] for tasks assessed/performed      Past Medical History  Diagnosis Date  . Hypertension     Past Surgical History  Procedure Laterality Date  . Hernia repair      There were no vitals filed for this visit.  Visit Diagnosis:  Dizziness and giddiness  Other abnormalities of gait and mobility      Subjective Assessment - 01/27/16 1311    Subjective The patient reports that he is doing more home exercises, but has not returned to the gym.  He reports dizziness is "fair", he notes occasional episodes of dizziness lasting minutes described further as room moving side to side with lightheaded sensation.    Patient Stated Goals "I would like to be more balanced and get back to exercising."      NEUROMUSCULAR RE-EDUCATION: Reviewed all prior HEP (see patient instructions) Rolling supine <>R and L sidelying with mild c/o dizziness when returning to supine (used to provoke symptoms with roll to the right side) Progressed gaze x 1 viewing to tandem stance for balance with cues to be near support surface. Continued gaze x 2 seated activities for HEP Recommended continue 180 degree turns vs 360 degree turns for HEP habituation activities   SELF CARE/HOME MANAGEMENT: Reviewed home walking and return to gym plan.        PT Short Term Goals - 12/24/15  1305    PT SHORT TERM GOAL #1   Title STGs=LTGs           PT Long Term Goals - 01/27/16 1312    PT LONG TERM GOAL #1   Title Pt will verbalize understanding of BPPV and the role it plays in vertigo/dizziness. TARGET DATE = 01/23/16   Time 4   Period Weeks   Status Achieved   PT LONG TERM GOAL #2   Title The patient will improve SOT vestibular score to WNL of age related norms to demonstrate improved use of vestibular input for balance.   Baseline Patient 1% below normative value for age/height.     Time 4   Period Weeks   Status Partially Met   PT LONG TERM GOAL #3   Title The patient will improve FGA to 30/30 to demonstrate improvements in functional gait with dynamic activities.   Baseline Met on 01/25/2016   Time 4   Period Weeks   Status Achieved   PT LONG TERM GOAL #4   Title The patient will report symptoms of dizziness/light headedness 0/10 with horizontal rolling R x5 reps to demonstrate increased tolerance to positional changes.   Baseline Met on 01/25/2016:  patient notes increased symptoms when supine at this time versus when rolling to the right side.    Time 4   Period Weeks   Status Partially Met   PT LONG TERM GOAL #5  Title The patient will improve DHI from 48 to 32 to demonstrate improvements in dizziness and function. Modified End Date:  02/24/2016   Baseline DHI = 48   Time 4   Period Weeks   Status On-going   PT LONG TERM GOAL #6   Title The patient will return to community exercise/wellness program.  End Date 02/24/2016   Time 4   Period Weeks   Status New               Plan - 01/27/16 1305    Clinical Impression Statement The patient continues with intermittent symptoms.  He is exercising more in the home and plans to return to gym next week.  PT to see in 2 weeks to ensure transition to community exercise going well.  PT encouraging patient to push HEP and return to prior activities.     Pt will benefit from skilled therapeutic intervention  in order to improve on the following deficits Decreased balance;Dizziness   Rehab Potential Good   PT Frequency 1x / week   PT Duration 4 weeks   PT Treatment/Interventions Canalith Repostioning;Gait training;Stair training;Therapeutic activities;Therapeutic exercise;Balance training;Neuromuscular re-education;Patient/family education;Manual techniques;Vestibular;DME Instruction   PT Next Visit Plan Review HEP, progress as needed, community reintegration for wellness program.   Consulted and Agree with Plan of Care Patient        Problem List There are no active problems to display for this patient.   [place], PT 01/27/2016, 1:12 PM  [place] [facility] [address] [place], [place], [address] Phone: [phone]   Fax:  [phone]  Name: ELEAZAR KIMMEY MRN: [id number] Date of Birth: 1966/10/21

## 2016-02-01 ENCOUNTER — Encounter: Payer: BLUE CROSS/BLUE SHIELD | Admitting: Rehabilitative and Restorative Service Providers"

## 2016-02-07 DIAGNOSIS — L719 Rosacea, unspecified: Secondary | ICD-10-CM | POA: Diagnosis not present

## 2016-02-07 DIAGNOSIS — L711 Rhinophyma: Secondary | ICD-10-CM | POA: Diagnosis not present

## 2016-02-07 DIAGNOSIS — L821 Other seborrheic keratosis: Secondary | ICD-10-CM | POA: Diagnosis not present

## 2016-02-09 ENCOUNTER — Ambulatory Visit
Payer: BLUE CROSS/BLUE SHIELD | Attending: Internal Medicine | Admitting: Rehabilitative and Restorative Service Providers"

## 2016-02-09 ENCOUNTER — Encounter: Payer: BLUE CROSS/BLUE SHIELD | Admitting: Rehabilitative and Restorative Service Providers"

## 2016-02-09 DIAGNOSIS — R42 Dizziness and giddiness: Secondary | ICD-10-CM | POA: Diagnosis not present

## 2016-02-09 DIAGNOSIS — R2689 Other abnormalities of gait and mobility: Secondary | ICD-10-CM | POA: Diagnosis not present

## 2016-02-09 NOTE — Therapy (Signed)
[facility] [address] [place], [place], [address] Phone: [phone]   Fax:  [phone]  Physical Therapy Treatment  Patient Details  Name: Brad Henry MRN: [id number] Date of Birth: 03-25-1966 Referring Provider: Merrilee Seashore  Encounter Date: 02/09/2016      PT End of Session - 02/09/16 1244    Visit Number 9   Number of Visits 12   Date for PT Re-Evaluation 01/23/16   Authorization Type [employer]   PT Start Time 1443   PT Stop Time 1300   PT Time Calculation (min) 25 min   Activity Tolerance Patient tolerated treatment well   Behavior During Therapy [facility] for tasks assessed/performed      Past Medical History  Diagnosis Date  . Hypertension     Past Surgical History  Procedure Laterality Date  . Hernia repair      There were no vitals filed for this visit.      Subjective Assessment - 02/09/16 1236    Subjective The patient reports last week symptoms felt that they were getting worse.  He reports Monday was a good day and he went bowling with kids. He is reporting intermittent episodes of "ligthheadedness", head stuffiness, off balance, rocking sensations, and sensations of movement throughout the entire night.  He reports symptoms vary depending on the day.  He denies a sensation of room spinning, but feels more of a lightheadedness. He reports that he feels symptoms are worse with stressful situations. He notes last week was very stressful at work  and he thinks this may be related. "I've had more stress on me in the past 6 months at work."   Patient Stated Goals "I would like to be more balanced and get back to exercising."   Currently in Pain? No/denies           [facility] Adult PT Treatment/Exercise - 02/09/16 1300    Self-Care   Self-Care Other Self-Care Comments   Other Self-Care Comments  Discussed continuing HEP, journaling regarding symptoms to determine relationship to stress or other  triggers, and return to gym routine to help with stress mgmt.  Answered patient's questions regarding symptoms  Discussed avoiding online web groups as there are so many contributing factors for vertigo and this seemed to aggravate symptoms considerably last week.  Also discussed use of meclizine with recommendation to use when needed (as directed by MD) and to f/u with MD if questions about use.      DHI=14% (reduced from 48% at eval).         PT Short Term Goals - 12/24/15 1305    PT SHORT TERM GOAL #1   Title STGs=LTGs           PT Long Term Goals - 02/09/16 1245    PT LONG TERM GOAL #1   Title Pt will verbalize understanding of BPPV and the role it plays in vertigo/dizziness. TARGET DATE = 01/23/16   Time 4   Period Weeks   Status Achieved   PT LONG TERM GOAL #2   Title The patient will improve SOT vestibular score to WNL of age related norms to demonstrate improved use of vestibular input for balance.   Baseline Patient 1% below normative value for age/height.     Time 4   Period Weeks   Status Partially Met   PT LONG TERM GOAL #3   Title The patient will improve FGA to 30/30 to demonstrate improvements in functional gait with dynamic  activities.   Baseline Met on 01/25/2016   Time 4   Period Weeks   Status Achieved   PT LONG TERM GOAL #4   Title The patient will report symptoms of dizziness/light headedness 0/10 with horizontal rolling R x5 reps to demonstrate increased tolerance to positional changes.   Baseline Met on 01/25/2016:  patient notes increased symptoms when supine at this time versus when rolling to the right side.    Time 4   Period Weeks   Status Partially Met   PT LONG TERM GOAL #5   Title The patient will improve DHI from 48 to 32 to demonstrate improvements in dizziness and function. Modified End Date:  02/24/2016   Baseline Reduced to 14% from 48%   Time 4   Period Weeks   Status Achieved   PT LONG TERM GOAL #6   Title The patient will return to  community exercise/wellness program.  End Date 02/24/2016   Baseline Patient walking for exercise and doing home exercises.    Time 4   Period Weeks   Status Achieved               Plan - 02/09/16 1259    Clinical Impression Statement The patient has advanced, comprehensive home exercises.  He continues with variable symptoms in physical therapy that appear to be related to stressful weeks in home/work life.  PT discussed continuing current HEP, using exercise to assist with stress mgmt and following up with MD as needed.  PT discharging today with HEP.    PT Treatment/Interventions Canalith Repostioning;Gait training;Stair training;Therapeutic activities;Therapeutic exercise;Balance training;Neuromuscular re-education;Patient/family education;Manual techniques;Vestibular;DME Instruction   PT Next Visit Plan Discharge today.   Consulted and Agree with Plan of Care Patient      Patient will benefit from skilled therapeutic intervention in order to improve the following deficits and impairments:  Decreased balance, Dizziness  Visit Diagnosis: Dizziness and giddiness  Other abnormalities of gait and mobility  PHYSICAL THERAPY DISCHARGE SUMMARY  Visits from Start of Care: see above  Current functional level related to goals / functional outcomes: See above   Remaining deficits: Intermittent vertigo   Education / Equipment: HEP, self mgmt of symptoms, return to community exercise.  Plan: Patient agrees to discharge.  Patient goals were partially met. Patient is being discharged due to meeting the stated rehab goals.  ?????       Thank you for the referral of this patient. Rudell Cobb, MPT    Problem List There are no active problems to display for this patient.   [place], [place] 02/09/2016, 1:06 PM  [facility] [address] [place], [place], [address] Phone: [phone]   Fax:   [phone]  Name: Brad Henry MRN: [id number] Date of Birth: 1965/12/13

## 2016-02-28 ENCOUNTER — Ambulatory Visit (INDEPENDENT_AMBULATORY_CARE_PROVIDER_SITE_OTHER): Payer: BLUE CROSS/BLUE SHIELD | Admitting: Neurology

## 2016-02-28 ENCOUNTER — Encounter: Payer: Self-pay | Admitting: Neurology

## 2016-02-28 VITALS — BP 130/84 | HR 84 | Ht 72.0 in | Wt 202.5 lb

## 2016-02-28 DIAGNOSIS — H8110 Benign paroxysmal vertigo, unspecified ear: Secondary | ICD-10-CM

## 2016-02-28 DIAGNOSIS — F430 Acute stress reaction: Secondary | ICD-10-CM | POA: Diagnosis not present

## 2016-02-28 DIAGNOSIS — R202 Paresthesia of skin: Secondary | ICD-10-CM

## 2016-02-28 NOTE — Progress Notes (Signed)
Follow-up Visit   Date: 02/28/2016    Brad HomansDavid A Hurless MRN: [id number] DOB: Aug 11, 1966   Interim History: Brad Henry is a 50 y.o. right-handed African American male with hypertension returning to the clinic for follow-up of left sided paresthesias.  The patient was accompanied to the clinic by self.  History of present illness: Starting around October 2016, he began experiencing a fluttering sensation of the left anterior thigh which was most noticeable in the morning and last 15-60 min. It would often resolve with laying down for 15 minutes. Symptoms were occuring daily initially and slowly dissipated, he last experienced this in early December.  Around thanksgiving, he started having left foot tingling/numbness over the sole of the foot which lasted a week. This can occur when he wakes up in the morning nearly every day. Thereafter, he had a buzzing sensation over the left arm radiating into the chest, lasting 5-10 min. It is worse when he is stressed. If he move his hands, it has sometimes help resolve the symptoms. Symptoms are predominately on the left side, but also involve his right arm and leg. There is no weakness.  PCP's clinic notes, imaging and labs were reviewed. MRI cervical spine showed moderate right foraminal stenosis with possible C6 nerve root encroachment.   He had been working out 2-3 times per week until October but denies any injury.   He has been separated from his wife in June 2014. His father passed away in March 2015. He endorses significant stress related to personal and financial issues. He recalls not having any symptoms a day when he was off, but still experiences it on the weekends.  UPDATE 02/28/2016:  Since his last visit, he began noticing improved paresthesias, he still gets it occasionally but there is no specific location or pattern.  He does notice that all his symptoms are worse when under stressful situations.  MRI brain was normal.  He has  started to have dull headache for which he takes ibuprofen for once per week. For the past 3 months, he has been dealing with dizziness and "rocking" sensation.  He is seeing ENT for vertigo and doing vestibular therapy.  Because there has been no significant improvement, he will be seeing [facility] next week.    Medications:  Current Outpatient Prescriptions on File Prior to Visit  Medication Sig Dispense Refill  . cetirizine (ZYRTEC) 10 MG tablet Take 1 tablet (10 mg total) by mouth daily. 30 tablet 0  . Clindamycin-Benzoyl Per, Refr, gel   0  . fluticasone (FLONASE) 50 MCG/ACT nasal spray Place 2 sprays into both nostrils daily. 16 g 0  . lisinopril-hydrochlorothiazide (PRINZIDE,ZESTORETIC) 20-12.5 MG tablet Take 1 tablet by mouth daily.     No current facility-administered medications on file prior to visit.    Allergies:  Allergies  Allergen Reactions  . Acetaminophen Hives    Review of Systems:  CONSTITUTIONAL: No fevers, chills, night sweats, or weight loss.  EYES: No visual changes or eye pain ENT: No hearing changes.  No history of nose bleeds.   RESPIRATORY: No cough, wheezing and shortness of breath.   CARDIOVASCULAR: Negative for chest pain, and palpitations.   GI: Negative for abdominal discomfort, blood in stools or black stools.  No recent change in bowel habits.   GU:  No history of incontinence.   MUSCLOSKELETAL: No history of joint pain or swelling.  No myalgias.   SKIN: Negative for lesions, rash, and itching.   ENDOCRINE: Negative  for cold or heat intolerance, polydipsia or goiter.   PSYCH:  No depression +anxiety symptoms.   NEURO: As Above.   Vital Signs:  BP 130/84 mmHg  Pulse 84  Ht 6' (1.829 m)  Wt 202 lb 8 oz (91.853 kg)  BMI 27.46 kg/m2  SpO2 98%  Neurological Exam: MENTAL STATUS including orientation to time, place, person, recent and remote memory, attention span and concentration, language, and fund of knowledge is normal.  Speech is not  dysarthric.  CRANIAL NERVES:  No visual field defects.  Pupils equal round and reactive to light.  Normal conjugate, extra-ocular eye movements in all directions of gaze.  No nystagmus. No ptosis. Normal facial sensation.  Face is symmetric. Palate elevates symmetrically.  Tongue is midline.  MOTOR:  Motor strength is 5/5 in all extremities.  No atrophy, fasciculations or abnormal movements.  No pronator drift.  Tone is normal.    MSRs:  Reflexes are 2+/4 throughout.  SENSORY:  Intact to vibration throughout. Rhomberg testing negative.    COORDINATION/GAIT:  Normal finger-to- nose-finger and heel-to-shin.  Intact rapid alternating movements bilaterally.  Gait narrow based and stable.  Tandem gait intact.   Data: MRI cervical spine wo contrast 11/03/2015: 1. Essentially single level spondylosis at C5-6 with asymmetric right foraminal disc osteophyte complex causing moderate right foraminal narrowing and possible right C6 nerve root encroachment. No cord deformity. 2. No other significant disc space findings  MRI brain wwo contrast 12/06/2015:   1. Normal MRI appearance of the brain. 2. Left maxillary sinusitis.  Labs 11/25/2015: TSH 1.260, CBC and CMP within normal limits Labs 11/29/2015:  Vitamin B12 603, copper 90  IMPRESSION/PLAN: 1.  Nonspecific paresthesias, improved ?stress-related symptoms  - If symptoms worsen, plan for NCS/EMG  2.  Intermittent vertigo  - Neurological exam and MRI brain normal  - Followed by ENT, vestibular therapy completed  Return to clinic as needed   The duration of this appointment visit was 20 minutes of face-to-face time with the patient.  Greater than 50% of this time was spent in counseling, explanation of diagnosis, planning of further management, and coordination of care.   Thank you for allowing me to participate in patient's care.  If I can answer any additional questions, I would be pleased to do so.    Sincerely,    Donika K. Allena Katz,  DO

## 2016-03-13 DIAGNOSIS — R42 Dizziness and giddiness: Secondary | ICD-10-CM | POA: Diagnosis not present

## 2016-03-13 DIAGNOSIS — H902 Conductive hearing loss, unspecified: Secondary | ICD-10-CM | POA: Diagnosis not present

## 2016-03-14 DIAGNOSIS — R51 Headache: Secondary | ICD-10-CM | POA: Diagnosis not present

## 2016-03-14 DIAGNOSIS — R42 Dizziness and giddiness: Secondary | ICD-10-CM | POA: Diagnosis not present

## 2016-03-20 DIAGNOSIS — F419 Anxiety disorder, unspecified: Secondary | ICD-10-CM | POA: Diagnosis not present

## 2016-03-20 DIAGNOSIS — F320 Major depressive disorder, single episode, mild: Secondary | ICD-10-CM | POA: Diagnosis not present

## 2016-04-12 DIAGNOSIS — L719 Rosacea, unspecified: Secondary | ICD-10-CM | POA: Diagnosis not present

## 2016-04-12 DIAGNOSIS — L711 Rhinophyma: Secondary | ICD-10-CM | POA: Diagnosis not present

## 2016-04-12 DIAGNOSIS — L821 Other seborrheic keratosis: Secondary | ICD-10-CM | POA: Diagnosis not present

## 2016-04-24 DIAGNOSIS — R2681 Unsteadiness on feet: Secondary | ICD-10-CM | POA: Diagnosis not present

## 2016-04-24 DIAGNOSIS — F419 Anxiety disorder, unspecified: Secondary | ICD-10-CM | POA: Diagnosis not present

## 2016-06-01 DIAGNOSIS — I10 Essential (primary) hypertension: Secondary | ICD-10-CM | POA: Diagnosis not present

## 2016-06-01 DIAGNOSIS — Z125 Encounter for screening for malignant neoplasm of prostate: Secondary | ICD-10-CM | POA: Diagnosis not present

## 2016-06-01 DIAGNOSIS — Z0000 Encounter for general adult medical examination without abnormal findings: Secondary | ICD-10-CM | POA: Diagnosis not present

## 2016-06-08 DIAGNOSIS — Z0000 Encounter for general adult medical examination without abnormal findings: Secondary | ICD-10-CM | POA: Diagnosis not present

## 2016-06-08 DIAGNOSIS — K219 Gastro-esophageal reflux disease without esophagitis: Secondary | ICD-10-CM | POA: Diagnosis not present

## 2016-06-08 DIAGNOSIS — I10 Essential (primary) hypertension: Secondary | ICD-10-CM | POA: Diagnosis not present

## 2016-06-08 DIAGNOSIS — R42 Dizziness and giddiness: Secondary | ICD-10-CM | POA: Diagnosis not present

## 2016-07-04 DIAGNOSIS — B029 Zoster without complications: Secondary | ICD-10-CM | POA: Diagnosis not present

## 2016-07-19 DIAGNOSIS — L719 Rosacea, unspecified: Secondary | ICD-10-CM | POA: Diagnosis not present

## 2016-08-08 DIAGNOSIS — H8143 Vertigo of central origin, bilateral: Secondary | ICD-10-CM | POA: Diagnosis not present

## 2016-08-08 DIAGNOSIS — H93293 Other abnormal auditory perceptions, bilateral: Secondary | ICD-10-CM | POA: Diagnosis not present

## 2016-08-08 DIAGNOSIS — R42 Dizziness and giddiness: Secondary | ICD-10-CM | POA: Diagnosis not present

## 2016-08-08 DIAGNOSIS — H9313 Tinnitus, bilateral: Secondary | ICD-10-CM | POA: Diagnosis not present

## 2016-08-23 DIAGNOSIS — L719 Rosacea, unspecified: Secondary | ICD-10-CM | POA: Diagnosis not present

## 2016-09-08 DIAGNOSIS — F439 Reaction to severe stress, unspecified: Secondary | ICD-10-CM | POA: Diagnosis not present

## 2016-09-08 DIAGNOSIS — R42 Dizziness and giddiness: Secondary | ICD-10-CM | POA: Diagnosis not present

## 2016-09-08 DIAGNOSIS — F419 Anxiety disorder, unspecified: Secondary | ICD-10-CM | POA: Diagnosis not present

## 2016-09-20 DIAGNOSIS — Z23 Encounter for immunization: Secondary | ICD-10-CM | POA: Diagnosis not present

## 2016-10-04 DIAGNOSIS — R1313 Dysphagia, pharyngeal phase: Secondary | ICD-10-CM | POA: Diagnosis not present

## 2016-10-21 ENCOUNTER — Ambulatory Visit: Payer: Self-pay

## 2016-10-21 DIAGNOSIS — J019 Acute sinusitis, unspecified: Secondary | ICD-10-CM | POA: Diagnosis not present

## 2016-12-06 DIAGNOSIS — L719 Rosacea, unspecified: Secondary | ICD-10-CM | POA: Diagnosis not present

## 2016-12-06 DIAGNOSIS — L711 Rhinophyma: Secondary | ICD-10-CM | POA: Diagnosis not present

## 2016-12-13 DIAGNOSIS — L719 Rosacea, unspecified: Secondary | ICD-10-CM | POA: Diagnosis not present

## 2016-12-13 DIAGNOSIS — K219 Gastro-esophageal reflux disease without esophagitis: Secondary | ICD-10-CM | POA: Diagnosis not present

## 2016-12-13 DIAGNOSIS — R072 Precordial pain: Secondary | ICD-10-CM | POA: Diagnosis not present

## 2017-01-19 DIAGNOSIS — L700 Acne vulgaris: Secondary | ICD-10-CM | POA: Diagnosis not present

## 2017-01-19 DIAGNOSIS — L738 Other specified follicular disorders: Secondary | ICD-10-CM | POA: Diagnosis not present

## 2017-01-19 DIAGNOSIS — L718 Other rosacea: Secondary | ICD-10-CM | POA: Diagnosis not present

## 2017-03-21 DIAGNOSIS — R202 Paresthesia of skin: Secondary | ICD-10-CM | POA: Diagnosis not present

## 2017-03-21 DIAGNOSIS — M545 Low back pain: Secondary | ICD-10-CM | POA: Diagnosis not present

## 2017-03-21 DIAGNOSIS — F419 Anxiety disorder, unspecified: Secondary | ICD-10-CM | POA: Diagnosis not present

## 2017-03-29 IMAGING — MR MR CERVICAL SPINE W/O CM
4 of 5 series · 28 of 48 positions shown · non-contrast
Comparison: Radiographs 10/13/2015.

CLINICAL DATA: 49-year-old with neck pain radiating into the right
arm for 4 months. No acute injury or prior relevant surgery. Initial
encounter.

EXAM:
MRI CERVICAL SPINE WITHOUT CONTRAST
TECHNIQUE: Multiplanar, multisequence MR imaging of the cervical spine was
performed. No intravenous contrast was administered.

[Series 5: T1 · sagittal · 3.0mm · 0.66mm/px · 7 of 15 slices shown]
[im 1/15]
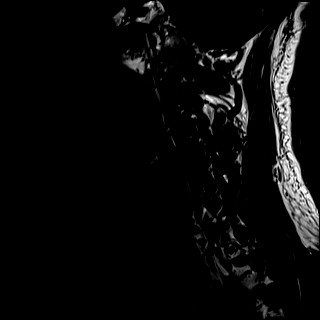
[im 3/15]
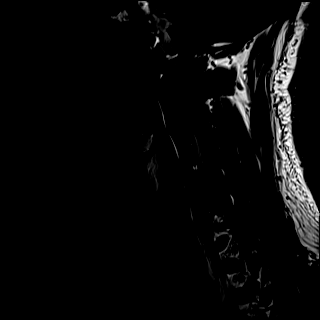
[im 5/15]
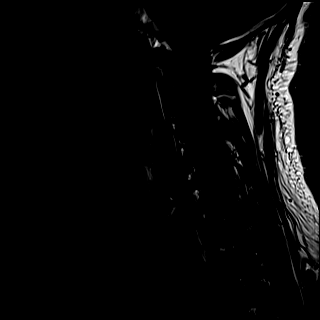
[im 8/15]
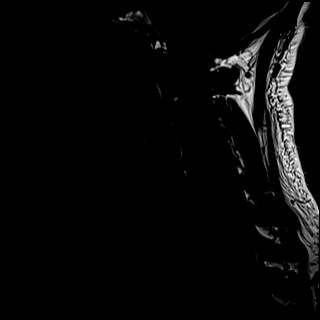
[im 10/15]
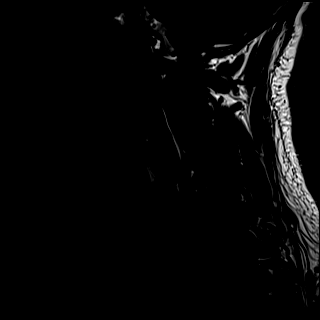
[im 12/15]
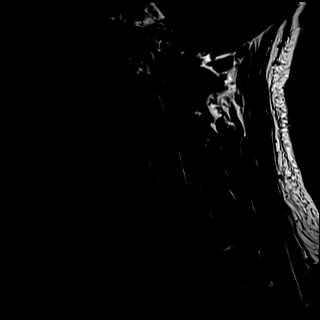
[im 15/15]
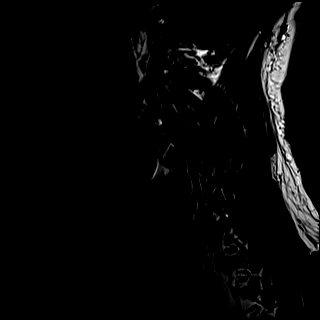

[Series 6: T2 · sagittal · 3.0mm · 0.55mm/px · 7 of 15 slices shown (1 of 2)]
[im 1/15]
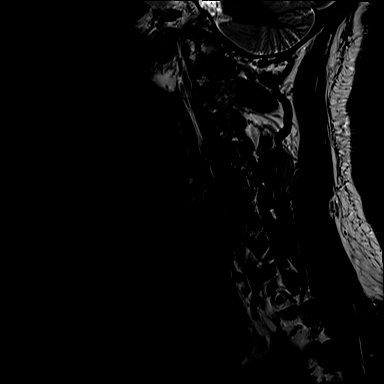
[im 3/15]
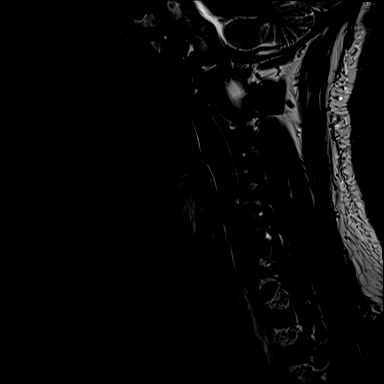
[im 5/15]
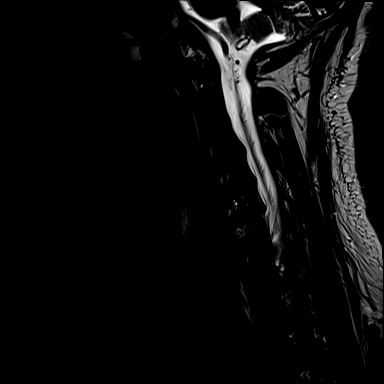
[im 8/15]
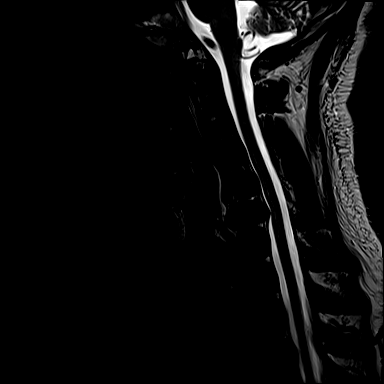
[im 10/15]
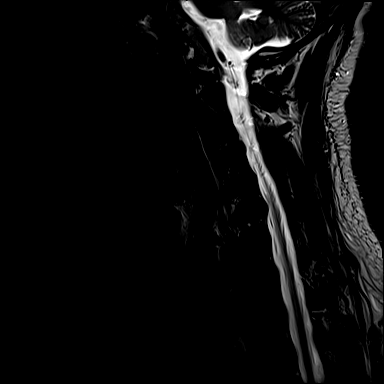
[im 12/15]
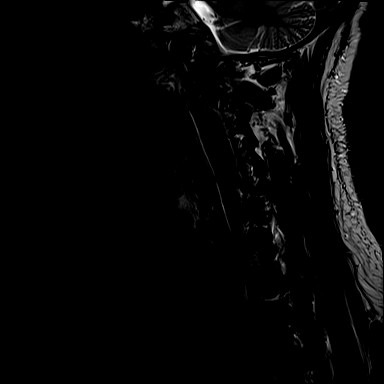
[im 15/15]
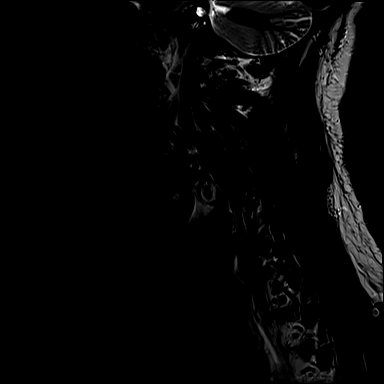

[Series 7: STIR · sagittal · 3.0mm · 0.33mm/px · 6 of 15 slices shown]
[im 1/15]
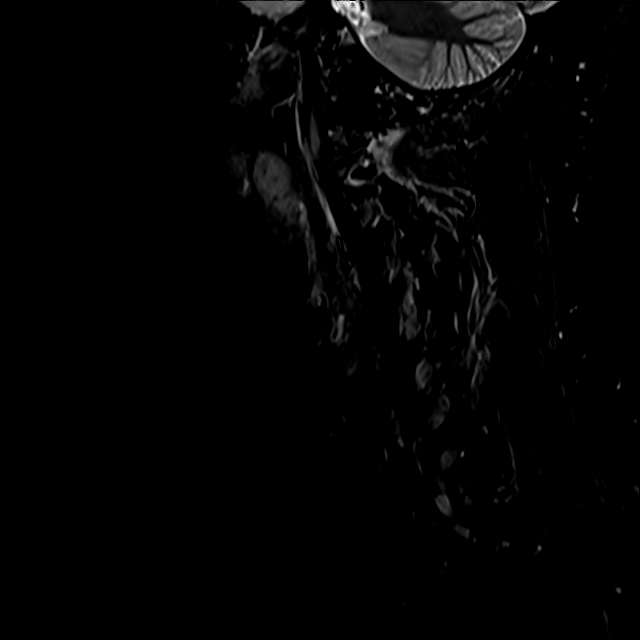
[im 3/15]
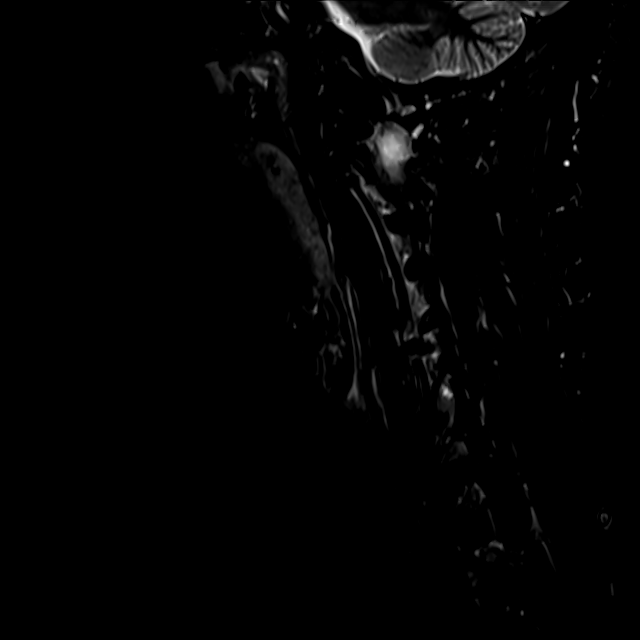
[im 6/15]
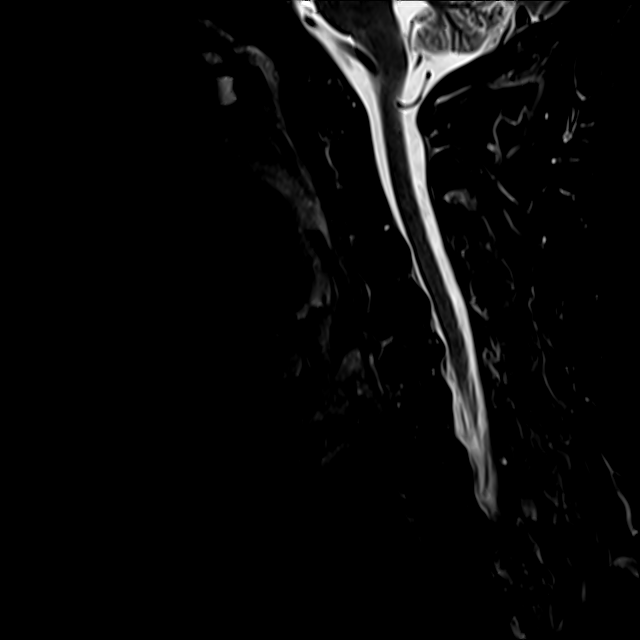
[im 9/15]
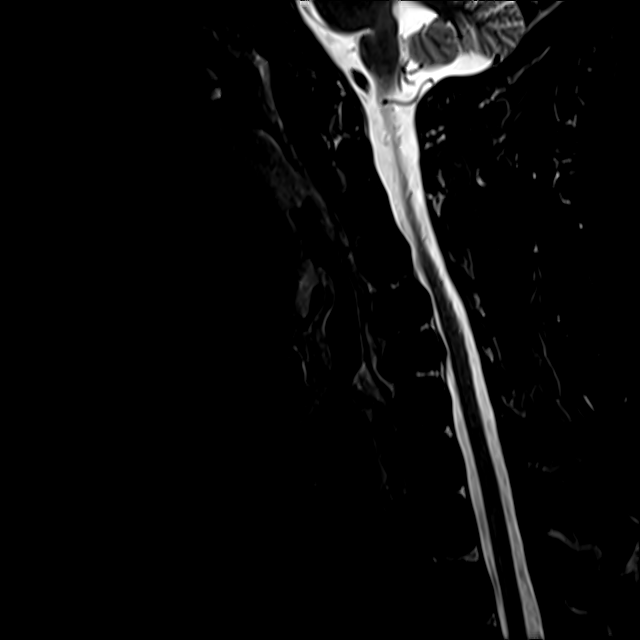
[im 12/15]
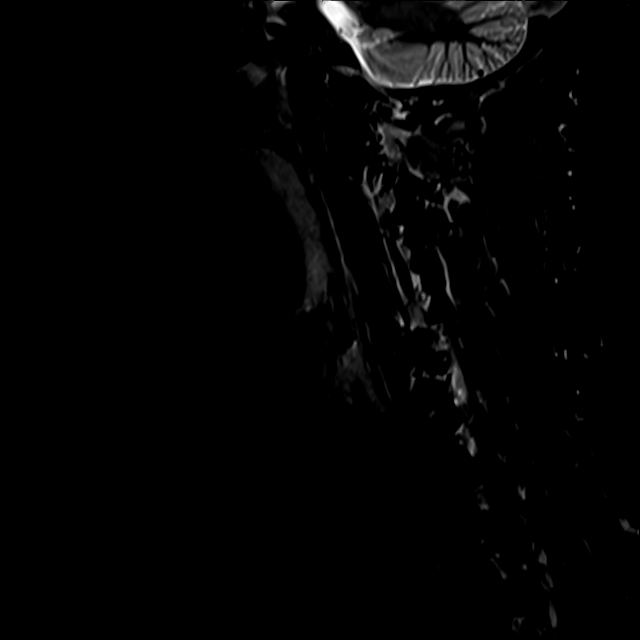
[im 15/15]
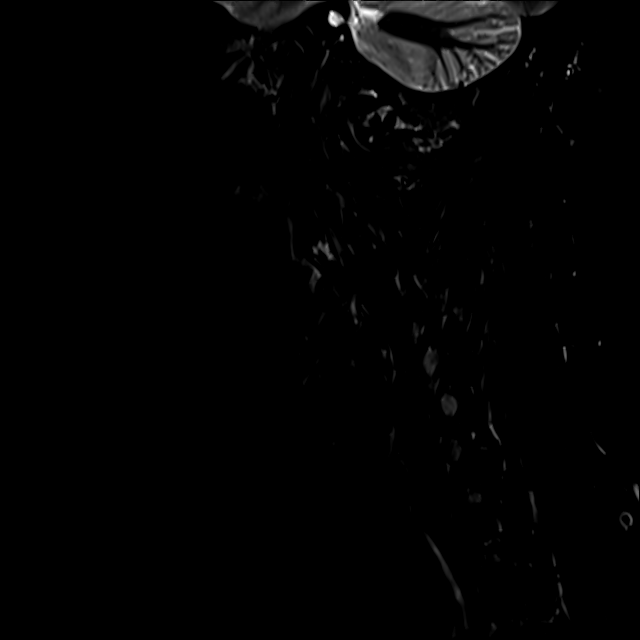

[Series 8: T2 · axial · 3.0mm · 0.50mm/px · z∈[-9,+99]mm · 8 of 34 slices shown (2 of 2)]
[im 1/34]
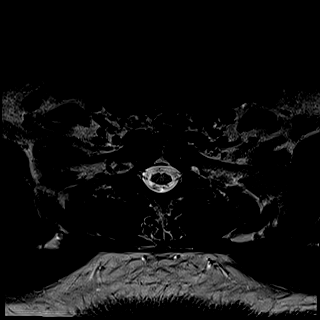
[im 6/34]
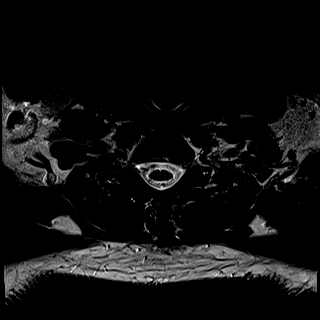
[im 11/34]
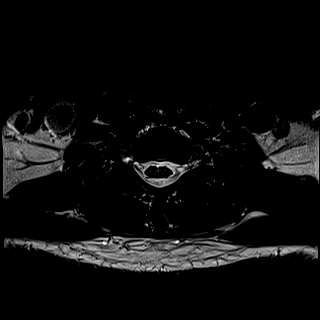
[im 16/34]
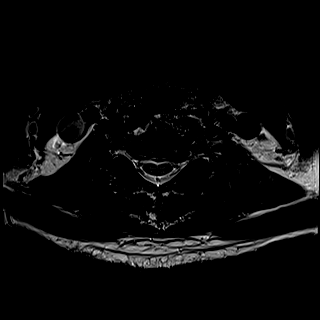
[im 18/34]
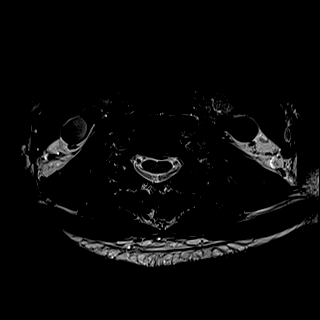
[im 23/34]
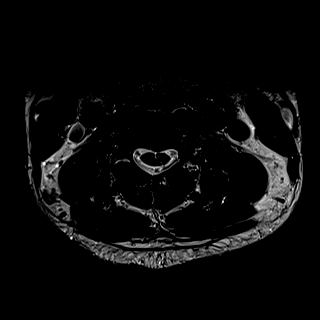
[im 28/34]
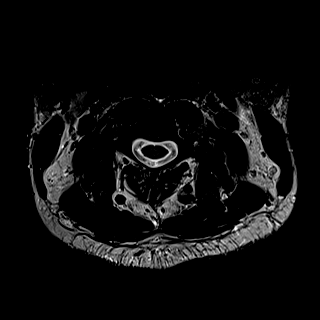
[im 34/34]
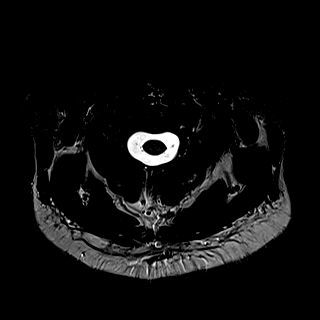

[28 of 48 positions shown; findings below may reference images not displayed]

FINDINGS: Alignment: Mild reversal of lordosis without focal angulation or
listhesis.

Bones: No suspicious osseous findings.

Cord: Normal in signal and caliber.

Posterior Fossa: Visualized portions unremarkable.

Vertebral Arteries: Bilateral vertebral artery flow voids.

Paraspinal tissues: Unremarkable.

Disc levels:

No significant disc space findings at or above C3-4.

C4-5: Mild disc bulging and uncinate spurring. No cord deformity or
foraminal compromise.

C5-6: Spondylosis with loss of disc height and posterior osteophytes
covering diffusely bulging disc material. There is asymmetric right
foraminal disc osteophyte formation causing moderate right foraminal
narrowing and possible right C6 nerve root encroachment. The left
foramen is mildly narrowed. No cord deformity.

C6-7: Mild disc bulging. No cord deformity or foraminal compromise.

C7-T1:  Normal interspace.
IMPRESSION: 1. Essentially single level spondylosis at C5-6 with asymmetric
right foraminal disc osteophyte complex causing moderate right
foraminal narrowing and possible right C6 nerve root encroachment.
No cord deformity.
2. No other significant disc space findings.

## 2017-04-26 DIAGNOSIS — L918 Other hypertrophic disorders of the skin: Secondary | ICD-10-CM | POA: Diagnosis not present

## 2017-04-26 DIAGNOSIS — L718 Other rosacea: Secondary | ICD-10-CM | POA: Diagnosis not present

## 2017-04-26 DIAGNOSIS — L700 Acne vulgaris: Secondary | ICD-10-CM | POA: Diagnosis not present

## 2017-04-26 DIAGNOSIS — L919 Hypertrophic disorder of the skin, unspecified: Secondary | ICD-10-CM | POA: Diagnosis not present

## 2017-06-15 DIAGNOSIS — L0292 Furuncle, unspecified: Secondary | ICD-10-CM | POA: Diagnosis not present

## 2017-06-29 DIAGNOSIS — Z0000 Encounter for general adult medical examination without abnormal findings: Secondary | ICD-10-CM | POA: Diagnosis not present

## 2017-06-29 DIAGNOSIS — Z125 Encounter for screening for malignant neoplasm of prostate: Secondary | ICD-10-CM | POA: Diagnosis not present

## 2017-07-06 DIAGNOSIS — L719 Rosacea, unspecified: Secondary | ICD-10-CM | POA: Diagnosis not present

## 2017-07-06 DIAGNOSIS — Z0000 Encounter for general adult medical examination without abnormal findings: Secondary | ICD-10-CM | POA: Diagnosis not present

## 2017-07-06 DIAGNOSIS — Z23 Encounter for immunization: Secondary | ICD-10-CM | POA: Diagnosis not present

## 2017-07-06 DIAGNOSIS — R42 Dizziness and giddiness: Secondary | ICD-10-CM | POA: Diagnosis not present

## 2017-07-06 DIAGNOSIS — I10 Essential (primary) hypertension: Secondary | ICD-10-CM | POA: Diagnosis not present

## 2017-11-16 DIAGNOSIS — J301 Allergic rhinitis due to pollen: Secondary | ICD-10-CM | POA: Diagnosis not present

## 2017-12-06 DIAGNOSIS — J310 Chronic rhinitis: Secondary | ICD-10-CM | POA: Diagnosis not present

## 2017-12-06 DIAGNOSIS — L040 Acute lymphadenitis of face, head and neck: Secondary | ICD-10-CM | POA: Diagnosis not present

## 2017-12-06 DIAGNOSIS — G44201 Tension-type headache, unspecified, intractable: Secondary | ICD-10-CM | POA: Diagnosis not present

## 2018-01-02 ENCOUNTER — Other Ambulatory Visit: Payer: Self-pay | Admitting: Otolaryngology

## 2018-01-02 DIAGNOSIS — G44201 Tension-type headache, unspecified, intractable: Secondary | ICD-10-CM

## 2018-01-02 DIAGNOSIS — J310 Chronic rhinitis: Secondary | ICD-10-CM

## 2018-01-04 ENCOUNTER — Ambulatory Visit
Admission: RE | Admit: 2018-01-04 | Discharge: 2018-01-04 | Disposition: A | Discharge status: Home / Self Care | Payer: BLUE CROSS/BLUE SHIELD | Source: Ambulatory Visit | Attending: Otolaryngology | Admitting: Otolaryngology

## 2018-01-04 DIAGNOSIS — J329 Chronic sinusitis, unspecified: Secondary | ICD-10-CM | POA: Diagnosis not present

## 2018-01-04 DIAGNOSIS — G44201 Tension-type headache, unspecified, intractable: Secondary | ICD-10-CM

## 2018-01-04 DIAGNOSIS — J310 Chronic rhinitis: Secondary | ICD-10-CM

## 2018-01-10 DIAGNOSIS — G44201 Tension-type headache, unspecified, intractable: Secondary | ICD-10-CM | POA: Diagnosis not present

## 2018-02-25 ENCOUNTER — Ambulatory Visit: Payer: BLUE CROSS/BLUE SHIELD | Admitting: Neurology

## 2018-02-25 ENCOUNTER — Encounter

## 2018-02-25 DIAGNOSIS — G43019 Migraine without aura, intractable, without status migrainosus: Secondary | ICD-10-CM | POA: Diagnosis not present

## 2018-02-25 DIAGNOSIS — Z79899 Other long term (current) drug therapy: Secondary | ICD-10-CM | POA: Diagnosis not present

## 2018-02-25 DIAGNOSIS — Z049 Encounter for examination and observation for unspecified reason: Secondary | ICD-10-CM | POA: Diagnosis not present

## 2018-02-25 DIAGNOSIS — R51 Headache: Secondary | ICD-10-CM | POA: Diagnosis not present

## 2018-03-05 DIAGNOSIS — G43019 Migraine without aura, intractable, without status migrainosus: Secondary | ICD-10-CM | POA: Diagnosis not present

## 2018-03-05 DIAGNOSIS — G509 Disorder of trigeminal nerve, unspecified: Secondary | ICD-10-CM | POA: Diagnosis not present

## 2018-03-05 DIAGNOSIS — G500 Trigeminal neuralgia: Secondary | ICD-10-CM | POA: Diagnosis not present

## 2018-03-05 DIAGNOSIS — M542 Cervicalgia: Secondary | ICD-10-CM | POA: Diagnosis not present

## 2018-03-05 DIAGNOSIS — G518 Other disorders of facial nerve: Secondary | ICD-10-CM | POA: Diagnosis not present

## 2018-03-05 DIAGNOSIS — M791 Myalgia, unspecified site: Secondary | ICD-10-CM | POA: Diagnosis not present

## 2018-03-12 DIAGNOSIS — G629 Polyneuropathy, unspecified: Secondary | ICD-10-CM | POA: Diagnosis not present

## 2018-03-20 DIAGNOSIS — G43019 Migraine without aura, intractable, without status migrainosus: Secondary | ICD-10-CM | POA: Diagnosis not present

## 2018-03-20 DIAGNOSIS — G518 Other disorders of facial nerve: Secondary | ICD-10-CM | POA: Diagnosis not present

## 2018-03-20 DIAGNOSIS — G509 Disorder of trigeminal nerve, unspecified: Secondary | ICD-10-CM | POA: Diagnosis not present

## 2018-03-20 DIAGNOSIS — G500 Trigeminal neuralgia: Secondary | ICD-10-CM | POA: Diagnosis not present

## 2018-03-20 DIAGNOSIS — M542 Cervicalgia: Secondary | ICD-10-CM | POA: Diagnosis not present

## 2018-04-03 DIAGNOSIS — G509 Disorder of trigeminal nerve, unspecified: Secondary | ICD-10-CM | POA: Diagnosis not present

## 2018-04-03 DIAGNOSIS — G518 Other disorders of facial nerve: Secondary | ICD-10-CM | POA: Diagnosis not present

## 2018-04-03 DIAGNOSIS — M542 Cervicalgia: Secondary | ICD-10-CM | POA: Diagnosis not present

## 2018-04-03 DIAGNOSIS — G43019 Migraine without aura, intractable, without status migrainosus: Secondary | ICD-10-CM | POA: Diagnosis not present

## 2018-04-03 DIAGNOSIS — M791 Myalgia, unspecified site: Secondary | ICD-10-CM | POA: Diagnosis not present

## 2018-04-03 DIAGNOSIS — G500 Trigeminal neuralgia: Secondary | ICD-10-CM | POA: Diagnosis not present

## 2018-04-11 DIAGNOSIS — F419 Anxiety disorder, unspecified: Secondary | ICD-10-CM | POA: Diagnosis not present

## 2018-04-11 DIAGNOSIS — J029 Acute pharyngitis, unspecified: Secondary | ICD-10-CM | POA: Diagnosis not present

## 2018-04-16 DIAGNOSIS — J040 Acute laryngitis: Secondary | ICD-10-CM | POA: Diagnosis not present

## 2018-04-16 DIAGNOSIS — R490 Dysphonia: Secondary | ICD-10-CM | POA: Diagnosis not present

## 2018-04-19 DIAGNOSIS — M791 Myalgia, unspecified site: Secondary | ICD-10-CM | POA: Diagnosis not present

## 2018-04-19 DIAGNOSIS — G43019 Migraine without aura, intractable, without status migrainosus: Secondary | ICD-10-CM | POA: Diagnosis not present

## 2018-04-19 DIAGNOSIS — G518 Other disorders of facial nerve: Secondary | ICD-10-CM | POA: Diagnosis not present

## 2018-04-19 DIAGNOSIS — M542 Cervicalgia: Secondary | ICD-10-CM | POA: Diagnosis not present

## 2018-05-08 DIAGNOSIS — M791 Myalgia, unspecified site: Secondary | ICD-10-CM | POA: Diagnosis not present

## 2018-05-08 DIAGNOSIS — M542 Cervicalgia: Secondary | ICD-10-CM | POA: Diagnosis not present

## 2018-05-08 DIAGNOSIS — G518 Other disorders of facial nerve: Secondary | ICD-10-CM | POA: Diagnosis not present

## 2018-05-08 DIAGNOSIS — G43019 Migraine without aura, intractable, without status migrainosus: Secondary | ICD-10-CM | POA: Diagnosis not present

## 2018-05-22 DIAGNOSIS — M542 Cervicalgia: Secondary | ICD-10-CM | POA: Diagnosis not present

## 2018-05-22 DIAGNOSIS — G43019 Migraine without aura, intractable, without status migrainosus: Secondary | ICD-10-CM | POA: Diagnosis not present

## 2018-05-22 DIAGNOSIS — G518 Other disorders of facial nerve: Secondary | ICD-10-CM | POA: Diagnosis not present

## 2018-05-22 DIAGNOSIS — M791 Myalgia, unspecified site: Secondary | ICD-10-CM | POA: Diagnosis not present

## 2018-06-19 DIAGNOSIS — M542 Cervicalgia: Secondary | ICD-10-CM | POA: Diagnosis not present

## 2018-06-19 DIAGNOSIS — G518 Other disorders of facial nerve: Secondary | ICD-10-CM | POA: Diagnosis not present

## 2018-06-19 DIAGNOSIS — M791 Myalgia, unspecified site: Secondary | ICD-10-CM | POA: Diagnosis not present

## 2018-06-19 DIAGNOSIS — G43019 Migraine without aura, intractable, without status migrainosus: Secondary | ICD-10-CM | POA: Diagnosis not present

## 2018-07-18 DIAGNOSIS — G43019 Migraine without aura, intractable, without status migrainosus: Secondary | ICD-10-CM | POA: Diagnosis not present

## 2018-07-18 DIAGNOSIS — M791 Myalgia, unspecified site: Secondary | ICD-10-CM | POA: Diagnosis not present

## 2018-07-18 DIAGNOSIS — M542 Cervicalgia: Secondary | ICD-10-CM | POA: Diagnosis not present

## 2018-07-18 DIAGNOSIS — G518 Other disorders of facial nerve: Secondary | ICD-10-CM | POA: Diagnosis not present

## 2018-07-19 DIAGNOSIS — Z131 Encounter for screening for diabetes mellitus: Secondary | ICD-10-CM | POA: Diagnosis not present

## 2018-07-19 DIAGNOSIS — Z0000 Encounter for general adult medical examination without abnormal findings: Secondary | ICD-10-CM | POA: Diagnosis not present

## 2018-07-19 DIAGNOSIS — Z125 Encounter for screening for malignant neoplasm of prostate: Secondary | ICD-10-CM | POA: Diagnosis not present

## 2018-07-19 DIAGNOSIS — I10 Essential (primary) hypertension: Secondary | ICD-10-CM | POA: Diagnosis not present

## 2018-07-26 DIAGNOSIS — Z23 Encounter for immunization: Secondary | ICD-10-CM | POA: Diagnosis not present

## 2018-07-26 DIAGNOSIS — I10 Essential (primary) hypertension: Secondary | ICD-10-CM | POA: Diagnosis not present

## 2018-07-26 DIAGNOSIS — R2681 Unsteadiness on feet: Secondary | ICD-10-CM | POA: Diagnosis not present

## 2018-07-26 DIAGNOSIS — Z0000 Encounter for general adult medical examination without abnormal findings: Secondary | ICD-10-CM | POA: Diagnosis not present

## 2018-08-16 DIAGNOSIS — M542 Cervicalgia: Secondary | ICD-10-CM | POA: Diagnosis not present

## 2018-08-16 DIAGNOSIS — G43019 Migraine without aura, intractable, without status migrainosus: Secondary | ICD-10-CM | POA: Diagnosis not present

## 2018-08-16 DIAGNOSIS — G518 Other disorders of facial nerve: Secondary | ICD-10-CM | POA: Diagnosis not present

## 2018-08-16 DIAGNOSIS — M791 Myalgia, unspecified site: Secondary | ICD-10-CM | POA: Diagnosis not present

## 2018-10-03 DIAGNOSIS — M542 Cervicalgia: Secondary | ICD-10-CM | POA: Diagnosis not present

## 2018-10-03 DIAGNOSIS — G43019 Migraine without aura, intractable, without status migrainosus: Secondary | ICD-10-CM | POA: Diagnosis not present

## 2018-10-03 DIAGNOSIS — G518 Other disorders of facial nerve: Secondary | ICD-10-CM | POA: Diagnosis not present

## 2018-10-03 DIAGNOSIS — M791 Myalgia, unspecified site: Secondary | ICD-10-CM | POA: Diagnosis not present

## 2018-11-11 DIAGNOSIS — M542 Cervicalgia: Secondary | ICD-10-CM | POA: Diagnosis not present

## 2018-11-11 DIAGNOSIS — G518 Other disorders of facial nerve: Secondary | ICD-10-CM | POA: Diagnosis not present

## 2018-11-11 DIAGNOSIS — M791 Myalgia, unspecified site: Secondary | ICD-10-CM | POA: Diagnosis not present

## 2018-11-11 DIAGNOSIS — G43019 Migraine without aura, intractable, without status migrainosus: Secondary | ICD-10-CM | POA: Diagnosis not present

## 2018-11-14 DIAGNOSIS — Z1211 Encounter for screening for malignant neoplasm of colon: Secondary | ICD-10-CM | POA: Diagnosis not present

## 2018-11-14 DIAGNOSIS — D123 Benign neoplasm of transverse colon: Secondary | ICD-10-CM | POA: Diagnosis not present

## 2018-11-14 DIAGNOSIS — D125 Benign neoplasm of sigmoid colon: Secondary | ICD-10-CM | POA: Diagnosis not present

## 2018-11-19 DIAGNOSIS — D123 Benign neoplasm of transverse colon: Secondary | ICD-10-CM | POA: Diagnosis not present

## 2018-11-19 DIAGNOSIS — D125 Benign neoplasm of sigmoid colon: Secondary | ICD-10-CM | POA: Diagnosis not present

## 2018-12-30 DIAGNOSIS — G518 Other disorders of facial nerve: Secondary | ICD-10-CM | POA: Diagnosis not present

## 2018-12-30 DIAGNOSIS — G43019 Migraine without aura, intractable, without status migrainosus: Secondary | ICD-10-CM | POA: Diagnosis not present

## 2018-12-30 DIAGNOSIS — M791 Myalgia, unspecified site: Secondary | ICD-10-CM | POA: Diagnosis not present

## 2018-12-30 DIAGNOSIS — M542 Cervicalgia: Secondary | ICD-10-CM | POA: Diagnosis not present

## 2019-02-17 DIAGNOSIS — K219 Gastro-esophageal reflux disease without esophagitis: Secondary | ICD-10-CM | POA: Diagnosis not present

## 2019-02-17 DIAGNOSIS — R1013 Epigastric pain: Secondary | ICD-10-CM | POA: Diagnosis not present

## 2019-02-18 DIAGNOSIS — M542 Cervicalgia: Secondary | ICD-10-CM | POA: Diagnosis not present

## 2019-02-18 DIAGNOSIS — G43019 Migraine without aura, intractable, without status migrainosus: Secondary | ICD-10-CM | POA: Diagnosis not present

## 2019-02-18 DIAGNOSIS — M791 Myalgia, unspecified site: Secondary | ICD-10-CM | POA: Diagnosis not present

## 2019-03-04 DIAGNOSIS — Z7189 Other specified counseling: Secondary | ICD-10-CM | POA: Diagnosis not present

## 2019-03-04 DIAGNOSIS — R1013 Epigastric pain: Secondary | ICD-10-CM | POA: Diagnosis not present

## 2019-03-04 DIAGNOSIS — K219 Gastro-esophageal reflux disease without esophagitis: Secondary | ICD-10-CM | POA: Diagnosis not present

## 2019-05-14 DIAGNOSIS — Z7189 Other specified counseling: Secondary | ICD-10-CM | POA: Diagnosis not present

## 2019-05-14 DIAGNOSIS — Z03818 Encounter for observation for suspected exposure to other biological agents ruled out: Secondary | ICD-10-CM | POA: Diagnosis not present

## 2019-05-26 DIAGNOSIS — G43019 Migraine without aura, intractable, without status migrainosus: Secondary | ICD-10-CM | POA: Diagnosis not present

## 2019-05-26 DIAGNOSIS — M542 Cervicalgia: Secondary | ICD-10-CM | POA: Diagnosis not present

## 2019-05-31 IMAGING — CT CT MAXILLOFACIAL W/O CM
3 of 4 series · 14 of 47 positions shown, 16 images · non-contrast
Comparison: None.

CLINICAL DATA: Chronic rhinitis, headache

EXAM:
CT MAXILLOFACIAL WITHOUT CONTRAST
TECHNIQUE: Multidetector CT images of the paranasal sinuses were obtained using
the standard protocol without intravenous contrast.

[Series 2: sinus 2.00 hr60 s3 ax · axial · 0.27mm/px · z∈[-755,-641]mm · 8 of 67 slices shown, 10 images]
[im 5/67  brain]
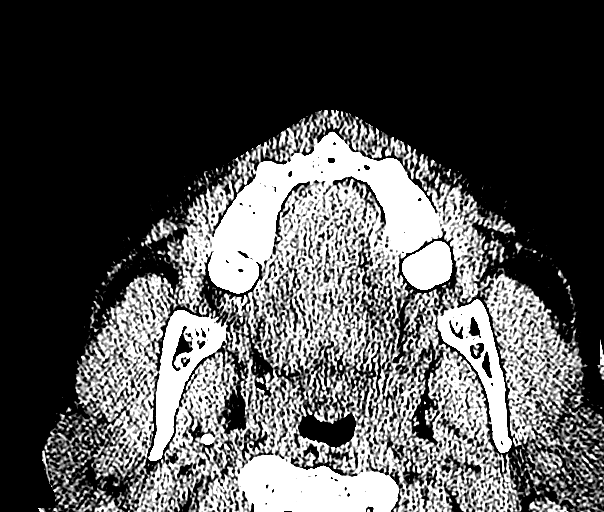
[im 5/67  bone]
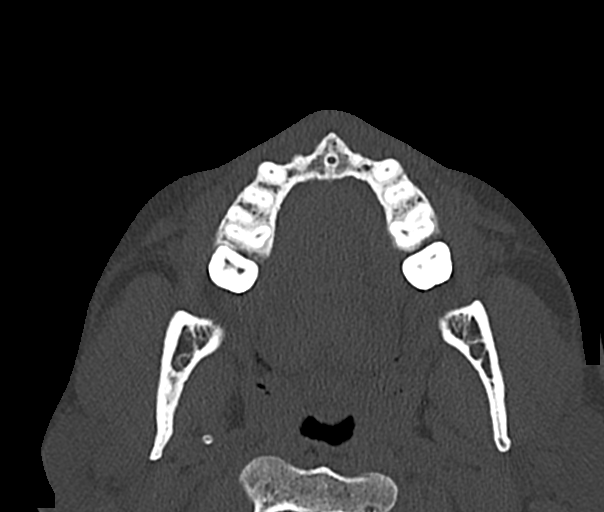
[im 15/67  bone]
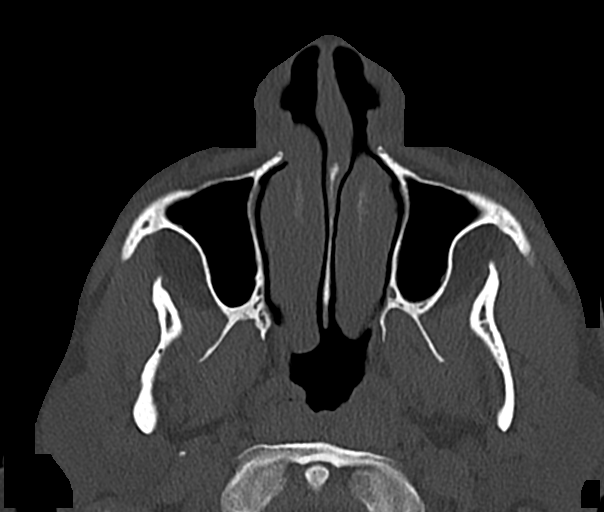
[im 24/67  bone]
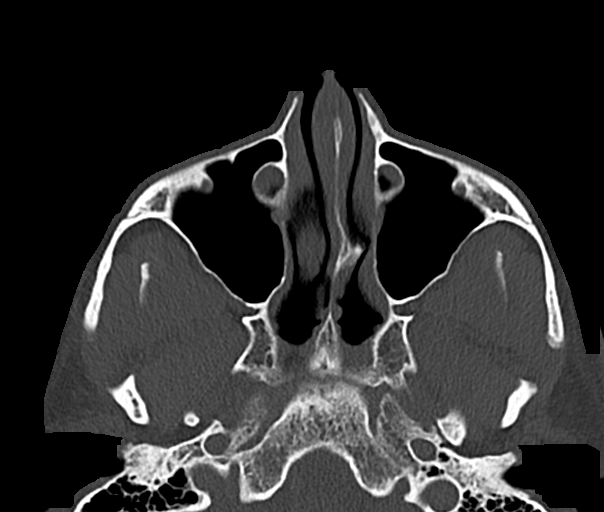
[im 29/67  bone]
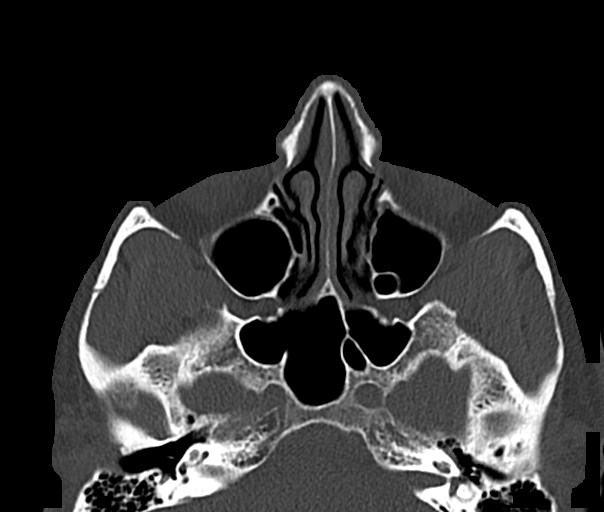
[im 38/67  brain]
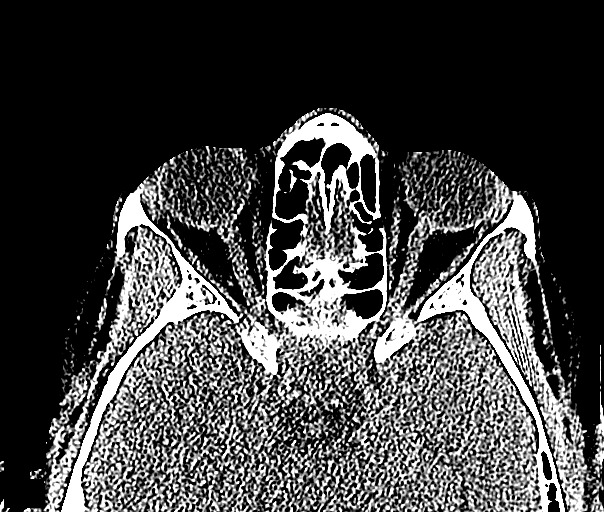
[im 38/67  bone]
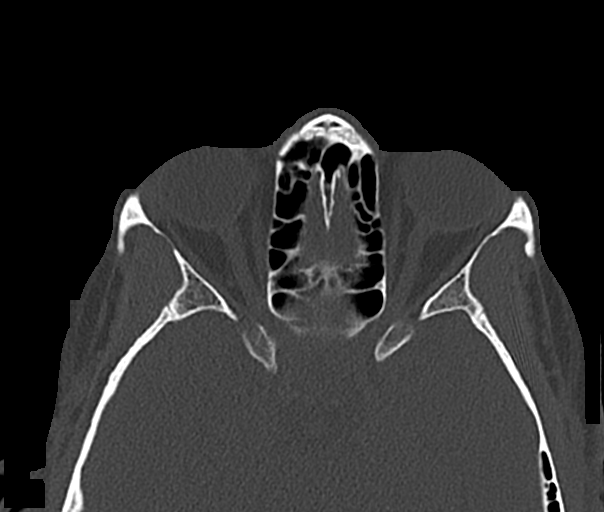
[im 43/67  bone]
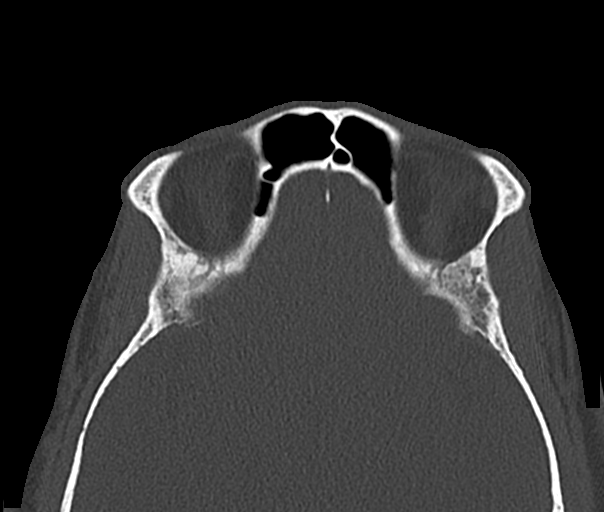
[im 52/67  bone]
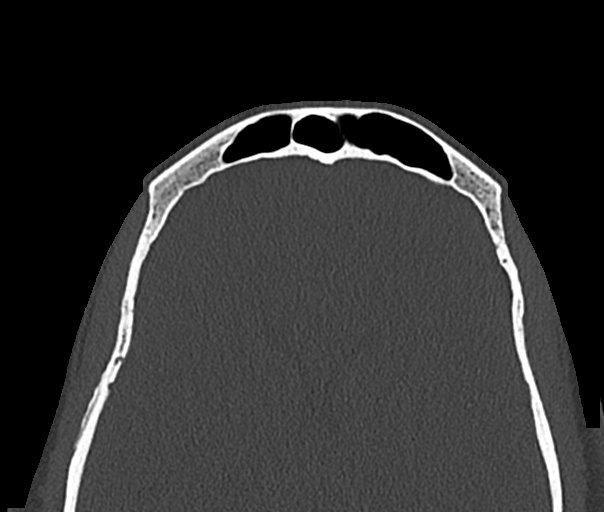
[im 62/67  bone]
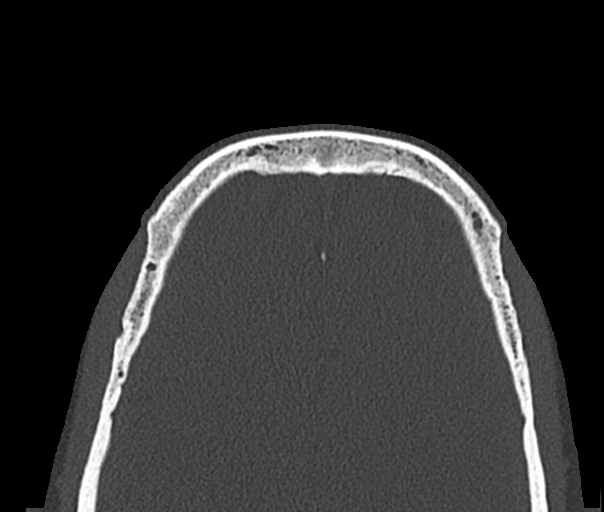

[Series 4: sinus 2.00 hr60 s3 cor · coronal · 0.26mm/px · 3 of 70 slices shown]
[im 24/70  bone]
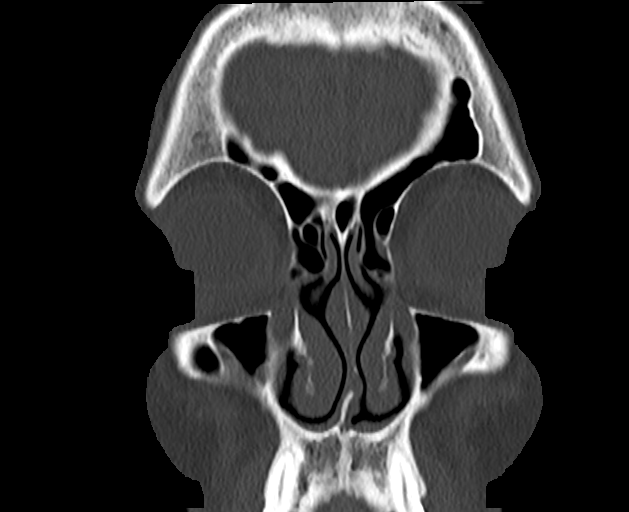
[im 31/70  bone]
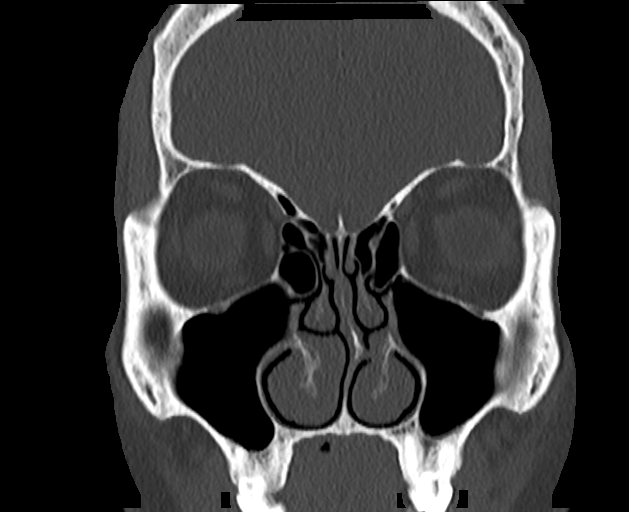
[im 39/70  bone]
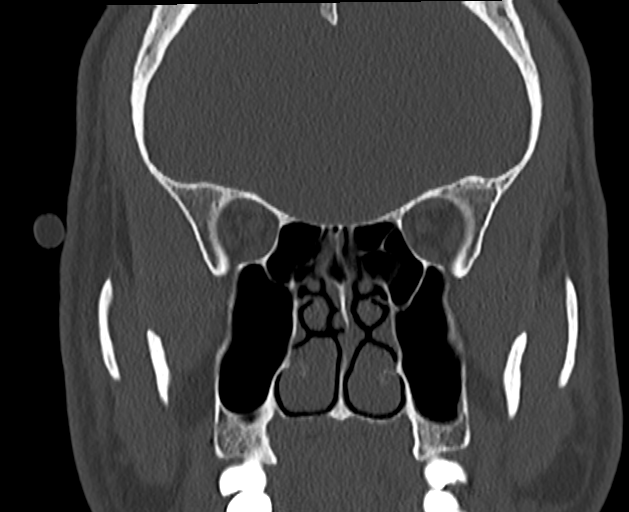

[Series 6: sinus 2.00 hr60 s3 sag · sagittal · 0.26mm/px · 3 of 82 slices shown]
[im 28/82  bone]
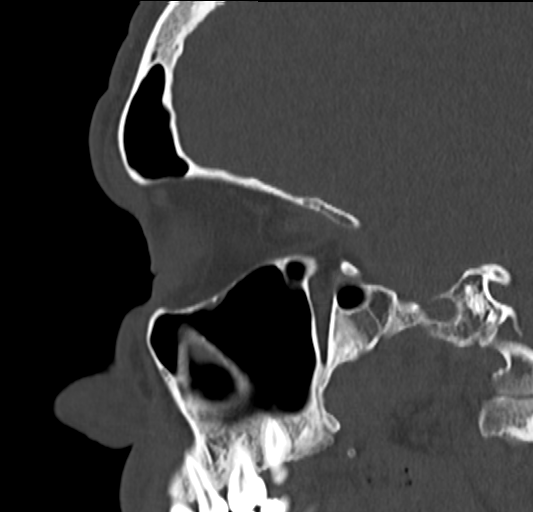
[im 41/82  bone]
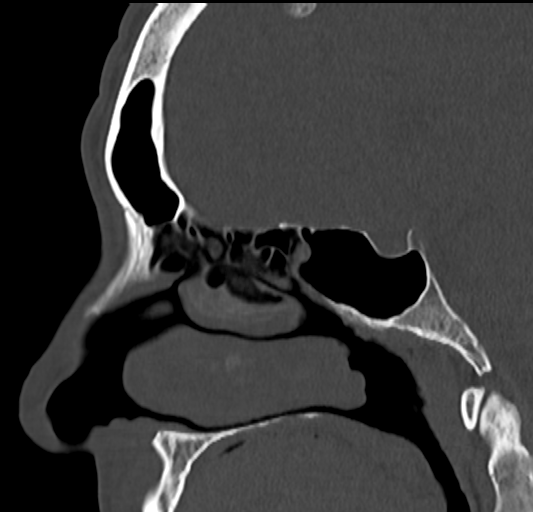
[im 55/82  bone]
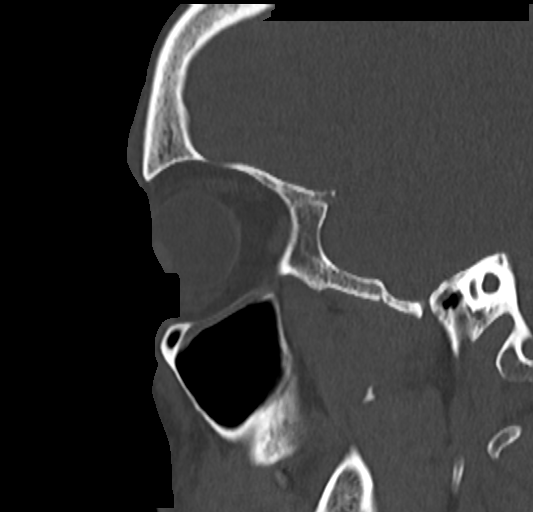

[14 of 47 positions shown; findings below may reference images not displayed]

FINDINGS: Paranasal sinuses:

Frontal: Normally aerated. Patent frontal sinus drainage pathways.

Ethmoid: Normally aerated and clear

Maxillary: Mild mucosal edema ostium of the maxillary sinus
bilaterally.

Sphenoid: Normally aerated. Patent sphenoethmoidal recesses.

Right ostiomeatal unit: Mildly narrowed due to mucosal edema and
Haller cell

Left ostiomeatal unit: Mildly narrowed due to mucosal edema and
Haller cell

Nasal passages: Mucosal edema of the turbinates bilaterally. Nasal
passage patent without mass. Nasal septum deviated to the left
prominent Haller cells bilaterally

Other: Limited intracranial imaging negative. Normal orbit
IMPRESSION: Mild mucosal edema in the maxillary sinus bilaterally. This is
causing mild narrowing of the ostiomeatal complex bilaterally.
Remaining sinuses clear.

## 2019-08-01 DIAGNOSIS — Z0000 Encounter for general adult medical examination without abnormal findings: Secondary | ICD-10-CM | POA: Diagnosis not present

## 2019-08-01 DIAGNOSIS — Z125 Encounter for screening for malignant neoplasm of prostate: Secondary | ICD-10-CM | POA: Diagnosis not present

## 2019-08-01 DIAGNOSIS — I10 Essential (primary) hypertension: Secondary | ICD-10-CM | POA: Diagnosis not present

## 2019-08-08 DIAGNOSIS — M779 Enthesopathy, unspecified: Secondary | ICD-10-CM | POA: Diagnosis not present

## 2019-08-08 DIAGNOSIS — K219 Gastro-esophageal reflux disease without esophagitis: Secondary | ICD-10-CM | POA: Diagnosis not present

## 2019-08-08 DIAGNOSIS — F320 Major depressive disorder, single episode, mild: Secondary | ICD-10-CM | POA: Diagnosis not present

## 2019-08-08 DIAGNOSIS — Z0000 Encounter for general adult medical examination without abnormal findings: Secondary | ICD-10-CM | POA: Diagnosis not present

## 2019-08-08 DIAGNOSIS — M1712 Unilateral primary osteoarthritis, left knee: Secondary | ICD-10-CM | POA: Diagnosis not present

## 2019-08-08 DIAGNOSIS — I10 Essential (primary) hypertension: Secondary | ICD-10-CM | POA: Diagnosis not present

## 2019-08-08 DIAGNOSIS — Z23 Encounter for immunization: Secondary | ICD-10-CM | POA: Diagnosis not present

## 2019-08-28 DIAGNOSIS — M542 Cervicalgia: Secondary | ICD-10-CM | POA: Diagnosis not present

## 2019-08-28 DIAGNOSIS — G43019 Migraine without aura, intractable, without status migrainosus: Secondary | ICD-10-CM | POA: Diagnosis not present

## 2020-08-11 ENCOUNTER — Other Ambulatory Visit: Payer: Self-pay

## 2020-08-11 DIAGNOSIS — Z20822 Contact with and (suspected) exposure to covid-19: Secondary | ICD-10-CM

## 2020-08-12 LAB — SPECIMEN STATUS REPORT

## 2020-08-12 LAB — SARS-COV-2, NAA 2 DAY TAT

## 2020-08-12 LAB — NOVEL CORONAVIRUS, NAA: SARS-CoV-2, NAA: NOT DETECTED

## 2020-08-16 ENCOUNTER — Other Ambulatory Visit: Payer: 59

## 2020-08-16 DIAGNOSIS — Z20822 Contact with and (suspected) exposure to covid-19: Secondary | ICD-10-CM

## 2020-08-17 LAB — NOVEL CORONAVIRUS, NAA: SARS-CoV-2, NAA: NOT DETECTED

## 2020-08-17 LAB — SARS-COV-2, NAA 2 DAY TAT

## 2020-08-17 LAB — SPECIMEN STATUS REPORT

## 2020-11-07 ENCOUNTER — Ambulatory Visit
Admission: EM | Admit: 2020-11-07 | Discharge: 2020-11-07 | Disposition: A | Discharge status: Home / Self Care | Payer: 59 | Attending: Family Medicine | Admitting: Family Medicine

## 2020-11-07 ENCOUNTER — Encounter: Payer: Self-pay | Admitting: Emergency Medicine

## 2020-11-07 ENCOUNTER — Other Ambulatory Visit: Payer: Self-pay

## 2020-11-07 DIAGNOSIS — R0981 Nasal congestion: Secondary | ICD-10-CM

## 2020-11-07 DIAGNOSIS — R509 Fever, unspecified: Secondary | ICD-10-CM

## 2020-11-07 DIAGNOSIS — U071 COVID-19: Secondary | ICD-10-CM

## 2020-11-07 MED ORDER — PREDNISONE 20 MG PO TABS: 40.0000 mg | ORAL_TABLET | Freq: Every day | ORAL | 0 refills | Status: DC

## 2020-11-07 NOTE — ED Triage Notes (Signed)
Pt here for fever and positive covid test x 6 days

## 2020-11-07 NOTE — Discharge Instructions (Signed)
Increase ibuprofen 600 mg every 8 hours for management of fever. Start prednisone 40 mg daily with food. Start nasal spray to improve congestion symptoms.  Follow-up with PCP if fever doesn't resolve in 3 days.

## 2020-11-07 NOTE — ED Provider Notes (Signed)
[facility]    CSN: [id number] Arrival date & time: 11/07/20  1323      History   Chief Complaint Chief Complaint  Patient presents with  . Fever    HPI Brad Henry is a 55 y.o. male.   HPI  Patient presents for evaluation of persistent fever following a positive COVID 19 home test x 6 days ago. Current symptom is daily fever TMAX 102. Allergic to tylenol and managing fever with ibuprofen. He has cough and severe sinus pressure. Declines SOB, wheezing, or chest tightness. Taking Bromfed for cough and has taken a dose of Ibuprofen today.  Past Medical History:  Diagnosis Date  . Hypertension     There are no problems to display for this patient.   Past Surgical History:  Procedure Laterality Date  . HERNIA REPAIR         Home Medications    Prior to Admission medications   Medication Sig Start Date End Date Taking? Authorizing Provider  cetirizine (ZYRTEC) 10 MG tablet Take 1 tablet (10 mg total) by mouth daily. 09/18/15   Charm Rings, MD  Clindamycin-Benzoyl Per, Refr, gel  10/13/15   [provider]  fluticasone (FLONASE) 50 MCG/ACT nasal spray Place 2 sprays into both nostrils daily. 09/18/15   Charm Rings, MD  lisinopril-hydrochlorothiazide (PRINZIDE,ZESTORETIC) 20-12.5 MG tablet Take 1 tablet by mouth daily.    [provider]  SOOLANTRA 1 % CREA APPLY EVERY DAY 02/08/16   [provider]    Family History Family History  Problem Relation Age of Onset  . Hypertension Mother   . Kidney cancer Father        Deceased, 46  . High Cholesterol Father   . Diabetes Father   . Healthy Brother   . Healthy Son   . Asperger's syndrome Son     Social History Social History   Tobacco Use  . Smoking status: Never Smoker  . Smokeless tobacco: Never Used  Substance Use Topics  . Alcohol use: Yes    Alcohol/week: 0.0 standard drinks    Comment: 4 beers per month, socially  . Drug use: No     Allergies    Acetaminophen   Review of Systems Review of Systems Pertinent negatives listed in HPI   Physical Exam Triage Vital Signs ED Triage Vitals [11/07/20 1436]  Enc Vitals Group     BP 118/76     Pulse Rate (!) 105     Resp 18     Temp 99.4 F (37.4 C)     Temp Source Oral     SpO2 96 %     Weight      Height      Head Circumference      Peak Flow      Pain Score 5     Pain Loc      Pain Edu?      Excl. in GC?    No data found.  Updated Vital Signs BP 118/76 (BP Location: Left Arm)   Pulse (!) 105   Temp 99.4 F (37.4 C) (Oral)   Resp 18   SpO2 96%   Visual Acuity Right Eye Distance:   Left Eye Distance:   Bilateral Distance:    Right Eye Near:   Left Eye Near:    Bilateral Near:     Physical Exam  General Appearance:    Alert, cooperative, no distress, acutely ill appearing, non toxic appearance   HENT:  Normocephalic, ears normal, nares mucosal edema with congestion, rhinorrhea, oropharynx    Eyes:    PERRL, conjunctiva/corneas clear, EOM's intact       Lungs:     Clear to auscultation bilaterally, respirations unlabored  Heart:    Regular rate and rhythm  Neurologic:   Awake, alert, oriented x 3. No apparent focal neurological           defect.     UC Treatments / Results  Labs (all labs ordered are listed, but only abnormal results are displayed) Labs Reviewed - No data to display  EKG   Radiology No results found.  Procedures Procedures (including critical care time)  Medications Ordered in UC Medications - No data to display  Initial Impression / Assessment and Plan / UC Course  I have reviewed the triage vital signs and the nursing notes.  Pertinent labs & imaging results that were available during my care of the patient were reviewed by me and considered in my medical decision making (see chart for details).    COVID-19 infection x 6 days with active fever. Lung exam reassuring.  Suspect fever difficult to manage due to  acetaminophen allergy.  Prednisone given for sinus pressure and congestion and encouraged use of nasal spray. Given home fevers, work note provided given additional 3 days off to allow for fever to resolve. Follow-up with PCP if symptoms or fever worsen. Final Clinical Impressions(s) / UC Diagnoses   Final diagnoses:  COVID-19 virus infection  Fever, unspecified   Discharge Instructions   None    ED Prescriptions    Medication Sig Dispense Auth. Provider   predniSONE (DELTASONE) 20 MG tablet Take 2 tablets (40 mg total) by mouth daily with breakfast. 10 tablet Bing Neighbors, FNP     PDMP not reviewed this encounter.   Bing Neighbors, FNP 11/07/20 1530

## 2020-11-10 DIAGNOSIS — Z1152 Encounter for screening for COVID-19: Secondary | ICD-10-CM | POA: Diagnosis not present

## 2021-08-19 DIAGNOSIS — Z125 Encounter for screening for malignant neoplasm of prostate: Secondary | ICD-10-CM | POA: Diagnosis not present

## 2021-08-19 DIAGNOSIS — Z0000 Encounter for general adult medical examination without abnormal findings: Secondary | ICD-10-CM | POA: Diagnosis not present

## 2021-08-26 DIAGNOSIS — Z23 Encounter for immunization: Secondary | ICD-10-CM | POA: Diagnosis not present

## 2021-08-26 DIAGNOSIS — I10 Essential (primary) hypertension: Secondary | ICD-10-CM | POA: Diagnosis not present

## 2021-08-26 DIAGNOSIS — K219 Gastro-esophageal reflux disease without esophagitis: Secondary | ICD-10-CM | POA: Diagnosis not present

## 2021-08-26 DIAGNOSIS — Z0000 Encounter for general adult medical examination without abnormal findings: Secondary | ICD-10-CM | POA: Diagnosis not present

## 2021-08-30 DIAGNOSIS — H33312 Horseshoe tear of retina without detachment, left eye: Secondary | ICD-10-CM | POA: Diagnosis not present

## 2021-08-30 DIAGNOSIS — H43393 Other vitreous opacities, bilateral: Secondary | ICD-10-CM | POA: Diagnosis not present

## 2021-08-30 DIAGNOSIS — H2513 Age-related nuclear cataract, bilateral: Secondary | ICD-10-CM | POA: Diagnosis not present

## 2021-08-30 DIAGNOSIS — H43813 Vitreous degeneration, bilateral: Secondary | ICD-10-CM | POA: Diagnosis not present

## 2021-09-09 DIAGNOSIS — H33312 Horseshoe tear of retina without detachment, left eye: Secondary | ICD-10-CM | POA: Diagnosis not present

## 2023-02-07 ENCOUNTER — Ambulatory Visit
Admission: EM | Admit: 2023-02-07 | Discharge: 2023-02-07 | Disposition: A | Discharge status: Home / Self Care | Payer: 59 | Attending: Internal Medicine | Admitting: Internal Medicine

## 2023-02-07 DIAGNOSIS — J069 Acute upper respiratory infection, unspecified: Secondary | ICD-10-CM | POA: Insufficient documentation

## 2023-02-07 DIAGNOSIS — Z1152 Encounter for screening for COVID-19: Secondary | ICD-10-CM | POA: Insufficient documentation

## 2023-02-07 MED ORDER — BENZONATATE 100 MG PO CAPS: 100.0000 mg | ORAL_CAPSULE | Freq: Three times a day (TID) | ORAL | 0 refills | Status: DC | PRN

## 2023-02-07 MED ORDER — FLUTICASONE PROPIONATE 50 MCG/ACT NA SUSP: 1.0000 | Freq: Every day | NASAL | 0 refills | Status: AC

## 2023-02-07 NOTE — Discharge Instructions (Signed)
You have a viral illness which should run its course and self resolve with the help of symptomatic treatment as we discussed.  I have prescribed you 2 medications to help alleviate symptoms.  COVID test is pending.  Will call if it is abnormal.  Please feel free to follow-up if any symptoms persist or worsen.

## 2023-02-07 NOTE — ED Provider Notes (Signed)
[facility]    CSN: [id number] Arrival date & time: 02/07/23  0806      History   Chief Complaint Chief Complaint  Patient presents with   Generalized Body Aches    HPI Brad Henry is a 57 y.o. male.   Patient presents with 5-day history of generalized bodyaches, fatigue, nasal congestion, cough.  Reports he had a sore throat the first day of symptoms which is now resolved.  Tmax at home was 101.  Last temp was 100 last night.  He denies any obvious known sick contacts.  Denies chest pain, shortness of breath, gastrointestinal symptoms.  Denies history of asthma or COPD and patient does not smoke cigarettes.  Patient reports he took a COVID test at home that was negative.     Past Medical History:  Diagnosis Date   Hypertension     There are no problems to display for this patient.   Past Surgical History:  Procedure Laterality Date   HERNIA REPAIR         Home Medications    Prior to Admission medications   Medication Sig Start Date End Date Taking? Authorizing Provider  benzonatate (TESSALON) 100 MG capsule Take 1 capsule (100 mg total) by mouth every 8 (eight) hours as needed for cough. 02/07/23  Yes Zennie Ayars, Rolly Salter E, FNP  fluticasone (FLONASE) 50 MCG/ACT nasal spray Place 1 spray into both nostrils daily. 02/07/23  Yes Maloree Uplinger, Acie Fredrickson, FNP  cetirizine (ZYRTEC) 10 MG tablet Take 1 tablet (10 mg total) by mouth daily. 09/18/15   Charm Rings, MD  Clindamycin-Benzoyl Per, Refr, gel  10/13/15   [provider]  lisinopril-hydrochlorothiazide (PRINZIDE,ZESTORETIC) 20-12.5 MG tablet Take 1 tablet by mouth daily.    [provider]  predniSONE (DELTASONE) 20 MG tablet Take 2 tablets (40 mg total) by mouth daily with breakfast. 11/07/20   Bing Neighbors, NP  SOOLANTRA 1 % CREA APPLY EVERY DAY 02/08/16   [provider]    Family History Family History  Problem Relation Age of Onset   Hypertension Mother    Kidney cancer  Father        Deceased, 7   High Cholesterol Father    Diabetes Father    Healthy Brother    Healthy Son    Asperger's syndrome Son     Social History Social History   Tobacco Use   Smoking status: Never   Smokeless tobacco: Never  Substance Use Topics   Alcohol use: Yes    Alcohol/week: 0.0 standard drinks of alcohol    Comment: 4 beers per month, socially   Drug use: No     Allergies   Acetaminophen   Review of Systems Review of Systems Per HPI  Physical Exam Triage Vital Signs ED Triage Vitals [02/07/23 0826]  Enc Vitals Group     BP (!) 149/89     Pulse Rate 100     Resp 16     Temp 98.5 F (36.9 C)     Temp Source Oral     SpO2 96 %     Weight      Height      Head Circumference      Peak Flow      Pain Score 4     Pain Loc      Pain Edu?      Excl. in GC?    No data found.  Updated Vital Signs BP (!) 149/89 (BP Location: Right  Arm)   Pulse 100   Temp 98.5 F (36.9 C) (Oral)   Resp 16   SpO2 96%   Visual Acuity Right Eye Distance:   Left Eye Distance:   Bilateral Distance:    Right Eye Near:   Left Eye Near:    Bilateral Near:     Physical Exam Constitutional:      General: He is not in acute distress.    Appearance: Normal appearance. He is not toxic-appearing or diaphoretic.  HENT:     Head: Normocephalic and atraumatic.     Right Ear: Tympanic membrane and ear canal normal.     Left Ear: Tympanic membrane and ear canal normal.     Nose: Congestion present.     Mouth/Throat:     Mouth: Mucous membranes are moist.     Pharynx: No posterior oropharyngeal erythema.  Eyes:     Extraocular Movements: Extraocular movements intact.     Conjunctiva/sclera: Conjunctivae normal.     Pupils: Pupils are equal, round, and reactive to light.  Cardiovascular:     Rate and Rhythm: Normal rate and regular rhythm.     Pulses: Normal pulses.     Heart sounds: Normal heart sounds.  Pulmonary:     Effort: Pulmonary effort is normal. No  respiratory distress.     Breath sounds: Normal breath sounds. No stridor. No wheezing, rhonchi or rales.  Abdominal:     General: Abdomen is flat. Bowel sounds are normal.     Palpations: Abdomen is soft.  Musculoskeletal:        General: Normal range of motion.     Cervical back: Normal range of motion.  Skin:    General: Skin is warm and dry.  Neurological:     General: No focal deficit present.     Mental Status: He is alert and oriented to person, place, and time. Mental status is at baseline.  Psychiatric:        Mood and Affect: Mood normal.        Behavior: Behavior normal.      UC Treatments / Results  Labs (all labs ordered are listed, but only abnormal results are displayed) Labs Reviewed  SARS CORONAVIRUS 2 (TAT 6-24 HRS)    EKG   Radiology No results found.  Procedures Procedures (including critical care time)  Medications Ordered in UC Medications - No data to display  Initial Impression / Assessment and Plan / UC Course  I have reviewed the triage vital signs and the nursing notes.  Pertinent labs & imaging results that were available during my care of the patient were reviewed by me and considered in my medical decision making (see chart for details).     Patient presents with symptoms likely from a viral upper respiratory infection.  Do not suspect underlying cardiopulmonary process. Symptoms seem unlikely related to ACS, CHF or COPD exacerbations, pneumonia, pneumothorax. Patient is nontoxic appearing and not in need of emergent medical intervention.  COVID test pending per patient request.  Rapid flu declined with shared decision making given duration of symptoms as it would not change treatment.  Recommended symptom control with medications and supportive care.  Patient was sent benzonatate for cough and Flonase nasal spray.  Return if symptoms fail to improve in 1-2 weeks or you develop shortness of breath, chest pain, severe headache. Patient  states understanding and is agreeable.  Discharged with PCP followup.  Final Clinical Impressions(s) / UC Diagnoses   Final diagnoses:  Viral  upper respiratory tract infection with cough     Discharge Instructions      You have a viral illness which should run its course and self resolve with the help of symptomatic treatment as we discussed.  I have prescribed you 2 medications to help alleviate symptoms.  COVID test is pending.  Will call if it is abnormal.  Please feel free to follow-up if any symptoms persist or worsen.    ED Prescriptions     Medication Sig Dispense Auth. Provider   fluticasone (FLONASE) 50 MCG/ACT nasal spray Place 1 spray into both nostrils daily. 16 g Emmauel Hallums, Rolly Salter E, [place]   benzonatate (TESSALON) 100 MG capsule Take 1 capsule (100 mg total) by mouth every 8 (eight) hours as needed for cough. 21 capsule [place], Acie Fredrickson, [place]      PDMP not reviewed this encounter.   Gustavus Bryant, [place] 02/07/23 [phone]

## 2023-02-07 NOTE — ED Triage Notes (Signed)
Pt c/o body aches, malaise, fatigue, fevers in the evenings, nasal congestion, headaches, nasal drainage   Denies sore throat,   Onset ~ sat

## 2023-02-08 LAB — SARS CORONAVIRUS 2 (TAT 6-24 HRS): SARS Coronavirus 2: NEGATIVE

## 2023-10-18 ENCOUNTER — Other Ambulatory Visit (HOSPITAL_BASED_OUTPATIENT_CLINIC_OR_DEPARTMENT_OTHER): Payer: Self-pay | Admitting: Internal Medicine

## 2023-10-18 DIAGNOSIS — E782 Mixed hyperlipidemia: Secondary | ICD-10-CM

## 2023-11-01 ENCOUNTER — Ambulatory Visit (HOSPITAL_COMMUNITY)
Admission: RE | Admit: 2023-11-01 | Discharge: 2023-11-01 | Disposition: A | Discharge status: Home / Self Care | Payer: Self-pay | Source: Ambulatory Visit | Attending: Internal Medicine | Admitting: Internal Medicine

## 2023-11-01 DIAGNOSIS — E782 Mixed hyperlipidemia: Secondary | ICD-10-CM | POA: Insufficient documentation

## 2024-02-05 ENCOUNTER — Ambulatory Visit
Admission: EM | Admit: 2024-02-05 | Discharge: 2024-02-05 | Disposition: A | Discharge status: Home / Self Care | Attending: Family Medicine | Admitting: Family Medicine

## 2024-02-05 DIAGNOSIS — M25512 Pain in left shoulder: Secondary | ICD-10-CM

## 2024-02-05 MED ORDER — MELOXICAM 15 MG PO TABS: 15.0000 mg | ORAL_TABLET | Freq: Every day | ORAL | 0 refills | Status: AC | PRN

## 2024-02-05 NOTE — ED Provider Notes (Signed)
 [facility]    CSN: [id number] Arrival date & time: 02/05/24  1750      History   Chief Complaint Chief Complaint  Patient presents with   Arm Pain    Recurrent    HPI Brad Henry is a 58 y.o. male.    Arm Pain  Here for left upper arm pain.  It began bothering him again about 2 or 3 months ago.  It is exacerbated by lying down and bothers him more at night.  Raising his arm at the shoulder does not bother him.  No numbness or tingling and no trauma or fall.  No fever or rash  About 1 year ago he was treated for similar symptoms by his primary care with some meloxicam and he did some shoulder exercises.  It did improve with that treatment.  He is allergic to acetaminophen  Past Medical History:  Diagnosis Date   Hypertension     There are no active problems to display for this patient.   Past Surgical History:  Procedure Laterality Date   HERNIA REPAIR         Home Medications    Prior to Admission medications   Medication Sig Start Date End Date Taking? Authorizing Provider  lisinopril-hydrochlorothiazide (PRINZIDE,ZESTORETIC) 20-12.5 MG tablet Take 15 tablets by mouth daily.   Yes [provider]  meloxicam (MOBIC) 15 MG tablet Take 1 tablet (15 mg total) by mouth daily as needed for pain. 02/05/24  Yes Ismael Karge, Janace Aris, MD  minocycline (MINOCIN) 50 MG capsule Take 50 mg by mouth daily. 07/19/16  Yes [provider]  vardenafil (LEVITRA) 20 MG tablet Take 20 mg by mouth daily as needed. 09/20/23  Yes [provider]  cetirizine (ZYRTEC) 10 MG tablet Take 1 tablet (10 mg total) by mouth daily. 09/18/15   Charm Rings, MD  Clindamycin-Benzoyl Per, Refr, gel  10/13/15   [provider]  fluticasone (FLONASE) 50 MCG/ACT nasal spray Place 1 spray into both nostrils daily. 02/07/23   Gustavus Bryant, FNP  SOOLANTRA 1 % CREA APPLY EVERY DAY 02/08/16   [provider]    Family History Family History   Problem Relation Age of Onset   Hypertension Mother    Kidney cancer Father        Deceased, 55   High Cholesterol Father    Diabetes Father    Healthy Brother    Healthy Son    Asperger's syndrome Son     Social History Social History   Tobacco Use   Smoking status: Never   Smokeless tobacco: Never  Vaping Use   Vaping status: Never Used  Substance Use Topics   Alcohol use: Yes    Alcohol/week: 0.0 standard drinks of alcohol    Comment: 4 beers per month, socially   Drug use: Not Currently     Allergies   Acetaminophen   Review of Systems Review of Systems   Physical Exam Triage Vital Signs ED Triage Vitals  Encounter Vitals Group     BP 02/05/24 1844 130/86     Systolic BP Percentile --      Diastolic BP Percentile --      Pulse Rate 02/05/24 1844 77     Resp 02/05/24 1844 18     Temp 02/05/24 1844 98.6 F (37 C)     Temp Source 02/05/24 1844 Oral     SpO2 02/05/24 1844 96 %     Weight 02/05/24 1842 216  lb (98 kg)     Height 02/05/24 1842 5' 11.5" (1.816 m)     Head Circumference --      Peak Flow --      Pain Score 02/05/24 1842 8     Pain Loc --      Pain Education --      Exclude from Growth Chart --    No data found.  Updated Vital Signs BP 130/86 (BP Location: Left Arm)   Pulse 77   Temp 98.6 F (37 C) (Oral)   Resp 18   Ht 5' 11.5" (1.816 m)   Wt 98 kg   SpO2 96%   BMI 29.71 kg/m   Visual Acuity Right Eye Distance:   Left Eye Distance:   Bilateral Distance:    Right Eye Near:   Left Eye Near:    Bilateral Near:     Physical Exam Vitals reviewed.  Constitutional:      General: He is not in acute distress.    Appearance: He is not ill-appearing, toxic-appearing or diaphoretic.  HENT:     Mouth/Throat:     Mouth: Mucous membranes are moist.  Eyes:     Extraocular Movements: Extraocular movements intact.     Conjunctiva/sclera: Conjunctivae normal.     Pupils: Pupils are equal, round, and reactive to light.   Cardiovascular:     Rate and Rhythm: Normal rate and regular rhythm.     Heart sounds: No murmur heard. Pulmonary:     Effort: Pulmonary effort is normal.     Breath sounds: Normal breath sounds.  Musculoskeletal:     Cervical back: Neck supple.     Comments: Range of motion is normal.  Distal pulses are normal in the left arm.  There are some mild tenderness over the anterior left shoulder joint.  Lymphadenopathy:     Cervical: No cervical adenopathy.  Skin:    Coloration: Skin is not pale.  Neurological:     General: No focal deficit present.     Mental Status: He is alert and oriented to person, place, and time.  Psychiatric:        Behavior: Behavior normal.      UC Treatments / Results  Labs (all labs ordered are listed, but only abnormal results are displayed) Labs Reviewed - No data to display  EKG   Radiology No results found.  Procedures Procedures (including critical care time)  Medications Ordered in UC Medications - No data to display  Initial Impression / Assessment and Plan / UC Course  I have reviewed the triage vital signs and the nursing notes.  Pertinent labs & imaging results that were available during my care of the patient were reviewed by me and considered in my medical decision making (see chart for details).     He stated he hope to find out if he had a rotator cuff tear tonight.  I discussed with him that I do think the symptoms might be consistent with that, but we do not do advanced imaging orders from our urgent cares.  Since he does not have any recent trauma, I do not think plain imaging would be of any benefit for him.  Meloxicam is sent in to treat the pain and he is given shoulder exercises.  He we will follow-up with his primary care and he is given contact information for orthopedics Final Clinical Impressions(s) / UC Diagnoses   Final diagnoses:  Acute pain of left shoulder  Discharge Instructions      Meloxicam 15  mg--1 daily as needed for pain.       ED Prescriptions     Medication Sig Dispense Auth. Provider   meloxicam (MOBIC) 15 MG tablet Take 1 tablet (15 mg total) by mouth daily as needed for pain. 15 tablet Niylah Hassan, Janace Aris, MD      PDMP not reviewed this encounter.   Zenia Resides, MD 02/05/24 [phone]

## 2024-02-05 NOTE — ED Triage Notes (Signed)
"  About a year ago I had some pain above left elbow/upper arm, I saw my doctor and this improved with exercises". "This pain has started back again in the same area (dull/ache) and sometimes radiates down arm to hand but no numbness or tingling". No chest pain. No sob. No wheezing. No injury known. "I go to the gym a few times per week and it is limiting that".

## 2024-02-05 NOTE — Discharge Instructions (Signed)
 Meloxicam 15 mg--1 daily as needed for pain.

## 2024-02-22 ENCOUNTER — Ambulatory Visit
Admission: EM | Admit: 2024-02-22 | Discharge: 2024-02-22 | Disposition: A | Discharge status: Home / Self Care | Attending: Family Medicine | Admitting: Family Medicine

## 2024-02-22 DIAGNOSIS — U071 COVID-19: Secondary | ICD-10-CM | POA: Diagnosis not present

## 2024-02-22 MED ORDER — IBUPROFEN 600 MG PO TABS: 600.0000 mg | ORAL_TABLET | Freq: Four times a day (QID) | ORAL | 0 refills | Status: AC | PRN

## 2024-02-22 MED ORDER — PAXLOVID (300/100) 20 X 150 MG & 10 X 100MG PO TBPK: 3.0000 | ORAL_TABLET | Freq: Two times a day (BID) | ORAL | 0 refills | Status: AC

## 2024-02-22 NOTE — ED Triage Notes (Signed)
"  This started Wednesday afternoon with sneezing, then Thursday with ha as well, runny nose later that day with chills at work, came home and did COVID19 test today (+ result). No fever known".

## 2024-02-22 NOTE — Discharge Instructions (Signed)
 You were diagnosed with Covid 19 today.  I have sent out paxlovid, and motrin for pain/fever.  Do not use LEVITRA  while taking paxlovid as they will increase levels of levitra in the body.  I recommend you get rest and fluids.  You may use over the counter medications for sinus congestion, drainage and cough.  Please return if not improving as expected.

## 2024-02-22 NOTE — ED Provider Notes (Signed)
 [facility]    CSN: [id number] Arrival date & time: 02/22/24  1303      History   Chief Complaint Chief Complaint  Patient presents with   COVID19 (+ @ home test)   Fever    HPI Brad Henry is a 58 y.o. male.    Fever Associated symptoms: congestion, cough and rhinorrhea    Patient is here for positive covid test at home.  Started 2 days ago with sneezing, then with headache, runny nose, chills and cough.  He was unaware of fever.  He is allergic to tylenol, and last took motrin at 10am today. (400mg ).  He does have his most recent blood work with him from 08/2023.  Normal renal function and liver function.        Past Medical History:  Diagnosis Date   Hypertension     There are no active problems to display for this patient.   Past Surgical History:  Procedure Laterality Date   HERNIA REPAIR         Home Medications    Prior to Admission medications   Medication Sig Start Date End Date Taking? Authorizing Provider  lisinopril-hydrochlorothiazide (PRINZIDE,ZESTORETIC) 20-12.5 MG tablet Take 15 tablets by mouth daily.   Yes [provider]  cetirizine  (ZYRTEC ) 10 MG tablet Take 1 tablet (10 mg total) by mouth daily. 09/18/15   Donnell Gails, MD  Clindamycin-Benzoyl Per, Refr, gel  10/13/15   [provider]  fluticasone  (FLONASE ) 50 MCG/ACT nasal spray Place 1 spray into both nostrils daily. 02/07/23   Dodson Freestone, FNP  meloxicam  (MOBIC ) 15 MG tablet Take 1 tablet (15 mg total) by mouth daily as needed for pain. 02/05/24   Banister, Pamela K, MD  minocycline (MINOCIN) 50 MG capsule Take 50 mg by mouth daily. 07/19/16   [provider]  SOOLANTRA 1 % CREA APPLY EVERY DAY 02/08/16   [provider]  vardenafil (LEVITRA) 20 MG tablet Take 20 mg by mouth daily as needed. 09/20/23   [provider]    Family History Family History  Problem Relation Age of Onset   Hypertension Mother    Kidney  cancer Father        Deceased, 88   High Cholesterol Father    Diabetes Father    Healthy Brother    Healthy Son    Asperger's syndrome Son     Social History Social History   Tobacco Use   Smoking status: Never   Smokeless tobacco: Never  Vaping Use   Vaping status: Never Used  Substance Use Topics   Alcohol use: Yes    Alcohol/week: 0.0 standard drinks of alcohol    Comment: 4 beers per month, socially   Drug use: Not Currently     Allergies   Acetaminophen   Review of Systems Review of Systems  Constitutional:  Positive for fever.  HENT:  Positive for congestion and rhinorrhea.   Respiratory:  Positive for cough.   Cardiovascular: Negative.   Gastrointestinal: Negative.   Genitourinary: Negative.   Musculoskeletal: Negative.   Psychiatric/Behavioral: Negative.       Physical Exam Triage Vital Signs ED Triage Vitals  Encounter Vitals Group     BP 02/22/24 1317 108/71     Systolic BP Percentile --      Diastolic BP Percentile --      Pulse Rate 02/22/24 1317 (!) 132     Resp 02/22/24 1317 20     Temp 02/22/24  1317 (!) 102.2 F (39 C)     Temp Source 02/22/24 1317 Oral     SpO2 02/22/24 1317 96 %     Weight 02/22/24 1315 216 lb (98 kg)     Height 02/22/24 1315 5' 11.5" (1.816 m)     Head Circumference --      Peak Flow --      Pain Score 02/22/24 1312 0     Pain Loc --      Pain Education --      Exclude from Growth Chart --    No data found.  Updated Vital Signs BP 108/71 (BP Location: Right Arm)   Pulse (!) 132   Temp (!) 102.2 F (39 C) (Oral)   Resp 20   Ht 5' 11.5" (1.816 m)   Wt 98 kg   SpO2 96%   BMI 29.71 kg/m   Visual Acuity Right Eye Distance:   Left Eye Distance:   Bilateral Distance:    Right Eye Near:   Left Eye Near:    Bilateral Near:     Physical Exam Constitutional:      General: He is not in acute distress.    Appearance: Normal appearance. He is normal weight. He is not ill-appearing or toxic-appearing.   HENT:     Nose: Congestion present. No rhinorrhea.     Mouth/Throat:     Mouth: Mucous membranes are moist.  Cardiovascular:     Rate and Rhythm: Normal rate and regular rhythm.  Pulmonary:     Effort: Pulmonary effort is normal.     Breath sounds: Normal breath sounds.  Musculoskeletal:     Cervical back: Normal range of motion and neck supple. No tenderness.  Lymphadenopathy:     Cervical: No cervical adenopathy.  Skin:    General: Skin is warm.  Neurological:     General: No focal deficit present.     Mental Status: He is alert.  Psychiatric:        Mood and Affect: Mood normal.      UC Treatments / Results  Labs (all labs ordered are listed, but only abnormal results are displayed) Labs Reviewed - No data to display  EKG   Radiology No results found.  Procedures Procedures (including critical care time)  Medications Ordered in UC Medications - No data to display  Initial Impression / Assessment and Plan / UC Course  I have reviewed the triage vital signs and the nursing notes.  Pertinent labs & imaging results that were available during my care of the patient were reviewed by me and considered in my medical decision making (see chart for details).    Final Clinical Impressions(s) / UC Diagnoses   Final diagnoses:  COVID-19 virus infection     Discharge Instructions      You were diagnosed with Covid 19 today.  I have sent out paxlovid , and motrin  for pain/fever.  Do not use LEVITRA  while taking paxlovid  as they will increase levels of levitra in the body.  I recommend you get rest and fluids.  You may use over the counter medications for sinus congestion, drainage and cough.  Please return if not improving as expected.     ED Prescriptions     Medication Sig Dispense Auth. Provider   ibuprofen  (ADVIL ) 600 MG tablet Take 1 tablet (600 mg total) by mouth every 6 (six) hours as needed. 30 tablet Piccola Arico, MD   nirmatrelvir/ritonavir  (PAXLOVID , 300/100,) 20 x 150 MG &  10 x 100MG  TBPK Take 3 tablets by mouth 2 (two) times daily for 5 days. Patient GFR is >60 Take nirmatrelvir (150 mg) two tablets twice daily for 5 days and ritonavir (100 mg) one tablet twice daily for 5 days. 30 tablet Lesle Ras, MD      PDMP not reviewed this encounter.   Lesle Ras, MD 02/22/24 [phone]
